# Patient Record
Sex: Female | Born: 1960 | Race: Black or African American | Hispanic: No | Smoking: Never smoker
Health system: Southern US, Community
[De-identification: ages and names within clinical notes are randomized; demographics above are authoritative.]

## PROBLEM LIST (undated history)

## (undated) DIAGNOSIS — I10 Essential (primary) hypertension: Secondary | ICD-10-CM

## (undated) DIAGNOSIS — M069 Rheumatoid arthritis, unspecified: Secondary | ICD-10-CM

## (undated) DIAGNOSIS — G4730 Sleep apnea, unspecified: Secondary | ICD-10-CM

## (undated) DIAGNOSIS — M1990 Unspecified osteoarthritis, unspecified site: Secondary | ICD-10-CM

## (undated) DIAGNOSIS — C50919 Malignant neoplasm of unspecified site of unspecified female breast: Secondary | ICD-10-CM

## (undated) DIAGNOSIS — Z9884 Bariatric surgery status: Secondary | ICD-10-CM

## (undated) DIAGNOSIS — R7303 Prediabetes: Secondary | ICD-10-CM

## (undated) DIAGNOSIS — K219 Gastro-esophageal reflux disease without esophagitis: Secondary | ICD-10-CM

## (undated) DIAGNOSIS — T7840XA Allergy, unspecified, initial encounter: Secondary | ICD-10-CM

## (undated) HISTORY — DX: Morbid (severe) obesity due to excess calories: E66.01

## (undated) HISTORY — PX: TUBAL LIGATION: SHX77

## (undated) HISTORY — DX: Allergy, unspecified, initial encounter: T78.40XA

## (undated) HISTORY — PX: TOTAL KNEE ARTHROPLASTY: SHX125

## (undated) HISTORY — DX: Unspecified osteoarthritis, unspecified site: M19.90

## (undated) HISTORY — PX: ABDOMINAL HYSTERECTOMY: SHX81

## (undated) HISTORY — DX: Rheumatoid arthritis, unspecified: M06.9

## (undated) HISTORY — DX: Gastro-esophageal reflux disease without esophagitis: K21.9

---

## 1981-09-25 HISTORY — PX: TYMPANIC MEMBRANE REPAIR: SHX294

## 2001-09-13 ENCOUNTER — Emergency Department (HOSPITAL_COMMUNITY): Admission: EM | Admit: 2001-09-13 | Discharge: 2001-09-13 | Payer: Self-pay | Admitting: Emergency Medicine

## 2002-01-21 ENCOUNTER — Emergency Department (HOSPITAL_COMMUNITY): Admission: EM | Admit: 2002-01-21 | Discharge: 2002-01-21 | Payer: Self-pay | Admitting: Emergency Medicine

## 2002-07-18 ENCOUNTER — Other Ambulatory Visit: Admission: RE | Admit: 2002-07-18 | Discharge: 2002-07-18 | Payer: Self-pay | Admitting: Family Medicine

## 2003-02-27 ENCOUNTER — Encounter: Payer: Self-pay | Admitting: Family Medicine

## 2003-02-27 ENCOUNTER — Ambulatory Visit (HOSPITAL_COMMUNITY): Admission: RE | Admit: 2003-02-27 | Discharge: 2003-02-27 | Payer: Self-pay | Admitting: Family Medicine

## 2003-10-10 ENCOUNTER — Emergency Department (HOSPITAL_COMMUNITY): Admission: EM | Admit: 2003-10-10 | Discharge: 2003-10-10 | Payer: Self-pay | Admitting: Emergency Medicine

## 2003-10-12 ENCOUNTER — Other Ambulatory Visit: Admission: RE | Admit: 2003-10-12 | Discharge: 2003-10-12 | Payer: Self-pay | Admitting: Gynecology

## 2003-12-22 ENCOUNTER — Ambulatory Visit (HOSPITAL_COMMUNITY): Admission: RE | Admit: 2003-12-22 | Discharge: 2003-12-22 | Payer: Self-pay | Admitting: Gastroenterology

## 2003-12-22 ENCOUNTER — Encounter (INDEPENDENT_AMBULATORY_CARE_PROVIDER_SITE_OTHER): Payer: Self-pay | Admitting: Specialist

## 2005-12-05 ENCOUNTER — Other Ambulatory Visit: Admission: RE | Admit: 2005-12-05 | Discharge: 2005-12-05 | Payer: Self-pay | Admitting: Family Medicine

## 2006-03-30 ENCOUNTER — Encounter: Admission: RE | Admit: 2006-03-30 | Discharge: 2006-03-30 | Payer: Self-pay | Admitting: Occupational Medicine

## 2006-08-23 ENCOUNTER — Emergency Department (HOSPITAL_COMMUNITY): Admission: EM | Admit: 2006-08-23 | Discharge: 2006-08-23 | Payer: Self-pay | Admitting: Emergency Medicine

## 2006-09-25 DIAGNOSIS — C50919 Malignant neoplasm of unspecified site of unspecified female breast: Secondary | ICD-10-CM

## 2006-09-25 HISTORY — DX: Malignant neoplasm of unspecified site of unspecified female breast: C50.919

## 2006-09-25 HISTORY — PX: MASTECTOMY: SHX3

## 2006-10-26 ENCOUNTER — Encounter: Admission: RE | Admit: 2006-10-26 | Discharge: 2006-10-26 | Payer: Self-pay | Admitting: Family Medicine

## 2006-10-26 ENCOUNTER — Encounter (INDEPENDENT_AMBULATORY_CARE_PROVIDER_SITE_OTHER): Payer: Self-pay | Admitting: *Deleted

## 2006-10-26 ENCOUNTER — Encounter (INDEPENDENT_AMBULATORY_CARE_PROVIDER_SITE_OTHER): Payer: Self-pay | Admitting: Diagnostic Radiology

## 2006-11-01 ENCOUNTER — Ambulatory Visit: Payer: Self-pay | Admitting: Oncology

## 2006-11-06 ENCOUNTER — Ambulatory Visit (HOSPITAL_COMMUNITY): Admission: RE | Admit: 2006-11-06 | Discharge: 2006-11-06 | Payer: Self-pay | Admitting: Oncology

## 2006-11-07 LAB — CANCER ANTIGEN 27.29: CA 27.29: 16 U/mL (ref 0–39)

## 2006-11-07 LAB — COMPREHENSIVE METABOLIC PANEL
ALT: 16 U/L (ref 0–35)
AST: 11 U/L (ref 0–37)
Albumin: 4.1 g/dL (ref 3.5–5.2)
Calcium: 9.1 mg/dL (ref 8.4–10.5)
Chloride: 100 mEq/L (ref 96–112)
Potassium: 3.7 mEq/L (ref 3.5–5.3)
Sodium: 137 mEq/L (ref 135–145)

## 2006-11-07 LAB — CBC WITH DIFFERENTIAL/PLATELET
BASO%: 0.6 % (ref 0.0–2.0)
EOS%: 0.8 % (ref 0.0–7.0)
MCH: 28 pg (ref 26.0–34.0)
MCHC: 34.1 g/dL (ref 32.0–36.0)
RDW: 15.2 % — ABNORMAL HIGH (ref 11.3–14.5)
lymph#: 2.2 10*3/uL (ref 0.9–3.3)

## 2006-11-07 LAB — LACTATE DEHYDROGENASE: LDH: 140 U/L (ref 94–250)

## 2006-11-08 ENCOUNTER — Ambulatory Visit (HOSPITAL_COMMUNITY): Admission: RE | Admit: 2006-11-08 | Discharge: 2006-11-08 | Payer: Self-pay | Admitting: Oncology

## 2006-11-08 ENCOUNTER — Ambulatory Visit (HOSPITAL_COMMUNITY): Admission: RE | Admit: 2006-11-08 | Discharge: 2006-11-08 | Payer: Self-pay | Admitting: Gynecology

## 2006-11-09 ENCOUNTER — Encounter: Admission: RE | Admit: 2006-11-09 | Discharge: 2006-11-09 | Payer: Self-pay | Admitting: Family Medicine

## 2006-11-14 LAB — CBC & DIFF AND RETIC
Eosinophils Absolute: 0.1 10*3/uL (ref 0.0–0.5)
IRF: 0.31 (ref 0.130–0.330)
MONO#: 0.5 10*3/uL (ref 0.1–0.9)
MONO%: 6.2 % (ref 0.0–13.0)
NEUT#: 4.9 10*3/uL (ref 1.5–6.5)
RBC: 3.83 10*6/uL (ref 3.70–5.32)
RDW: 14.8 % — ABNORMAL HIGH (ref 11.3–14.5)
RETIC #: 31.8 10*3/uL (ref 19.7–115.1)
Retic %: 0.8 % (ref 0.4–2.3)
WBC: 7.9 10*3/uL (ref 3.9–10.0)
lymph#: 2.5 10*3/uL (ref 0.9–3.3)

## 2006-11-14 LAB — IRON AND TIBC: Iron: 36 ug/dL — ABNORMAL LOW (ref 42–145)

## 2006-11-14 LAB — FERRITIN: Ferritin: 15 ng/mL (ref 10–291)

## 2006-11-20 ENCOUNTER — Encounter (INDEPENDENT_AMBULATORY_CARE_PROVIDER_SITE_OTHER): Payer: Self-pay | Admitting: Specialist

## 2006-11-20 ENCOUNTER — Encounter: Admission: RE | Admit: 2006-11-20 | Discharge: 2006-11-20 | Payer: Self-pay | Admitting: Family Medicine

## 2006-11-23 LAB — COMPREHENSIVE METABOLIC PANEL
ALT: 18 U/L (ref 0–35)
AST: 13 U/L (ref 0–37)
Albumin: 4 g/dL (ref 3.5–5.2)
BUN: 16 mg/dL (ref 6–23)
CO2: 27 mEq/L (ref 19–32)
Calcium: 9.4 mg/dL (ref 8.4–10.5)
Chloride: 103 mEq/L (ref 96–112)
Creatinine, Ser: 0.59 mg/dL (ref 0.40–1.20)
Potassium: 4 mEq/L (ref 3.5–5.3)

## 2006-11-23 LAB — CBC WITH DIFFERENTIAL/PLATELET
Basophils Absolute: 0 10*3/uL (ref 0.0–0.1)
HCT: 32.7 % — ABNORMAL LOW (ref 34.8–46.6)
HGB: 10.8 g/dL — ABNORMAL LOW (ref 11.6–15.9)
MONO#: 0.6 10*3/uL (ref 0.1–0.9)
NEUT#: 5.6 10*3/uL (ref 1.5–6.5)
NEUT%: 60.5 % (ref 39.6–76.8)
RDW: 14.9 % — ABNORMAL HIGH (ref 11.3–14.5)
WBC: 9.3 10*3/uL (ref 3.9–10.0)
lymph#: 3 10*3/uL (ref 0.9–3.3)

## 2006-11-23 LAB — LACTATE DEHYDROGENASE: LDH: 143 U/L (ref 94–250)

## 2006-12-04 ENCOUNTER — Encounter (INDEPENDENT_AMBULATORY_CARE_PROVIDER_SITE_OTHER): Payer: Self-pay | Admitting: *Deleted

## 2006-12-04 ENCOUNTER — Encounter (INDEPENDENT_AMBULATORY_CARE_PROVIDER_SITE_OTHER): Payer: Self-pay | Admitting: General Surgery

## 2006-12-05 ENCOUNTER — Encounter (INDEPENDENT_AMBULATORY_CARE_PROVIDER_SITE_OTHER): Payer: Self-pay | Admitting: General Surgery

## 2006-12-05 ENCOUNTER — Inpatient Hospital Stay (HOSPITAL_COMMUNITY): Admission: AD | Admit: 2006-12-05 | Discharge: 2006-12-06 | Payer: Self-pay | Admitting: General Surgery

## 2006-12-18 ENCOUNTER — Ambulatory Visit: Payer: Self-pay | Admitting: Oncology

## 2007-02-04 ENCOUNTER — Ambulatory Visit: Payer: Self-pay | Admitting: Oncology

## 2007-02-06 LAB — CBC WITH DIFFERENTIAL/PLATELET
BASO%: 0.7 % (ref 0.0–2.0)
EOS%: 1.7 % (ref 0.0–7.0)
Eosinophils Absolute: 0.1 10*3/uL (ref 0.0–0.5)
LYMPH%: 28.2 % (ref 14.0–48.0)
MCHC: 32.4 g/dL (ref 32.0–36.0)
MCV: 81.9 fL (ref 81.0–101.0)
MONO%: 6.4 % (ref 0.0–13.0)
NEUT#: 5.3 10*3/uL (ref 1.5–6.5)
Platelets: 261 10*3/uL (ref 145–400)
RBC: 3.93 10*6/uL (ref 3.70–5.32)
RDW: 13 % (ref 11.3–14.5)

## 2007-02-06 LAB — COMPREHENSIVE METABOLIC PANEL
ALT: 19 U/L (ref 0–35)
AST: 11 U/L (ref 0–37)
Albumin: 3.7 g/dL (ref 3.5–5.2)
Alkaline Phosphatase: 88 U/L (ref 39–117)
Glucose, Bld: 97 mg/dL (ref 70–99)
Potassium: 3.8 mEq/L (ref 3.5–5.3)
Sodium: 141 mEq/L (ref 135–145)
Total Bilirubin: 0.2 mg/dL — ABNORMAL LOW (ref 0.3–1.2)
Total Protein: 7.3 g/dL (ref 6.0–8.3)

## 2007-02-27 LAB — CBC WITH DIFFERENTIAL/PLATELET
Basophils Absolute: 0.1 10*3/uL (ref 0.0–0.1)
EOS%: 2 % (ref 0.0–7.0)
Eosinophils Absolute: 0.1 10*3/uL (ref 0.0–0.5)
HGB: 9.9 g/dL — ABNORMAL LOW (ref 11.6–15.9)
LYMPH%: 30.5 % (ref 14.0–48.0)
MCH: 26.7 pg (ref 26.0–34.0)
MCV: 80.5 fL — ABNORMAL LOW (ref 81.0–101.0)
MONO%: 7.4 % (ref 0.0–13.0)
NEUT%: 59.4 % (ref 39.6–76.8)
Platelets: 259 10*3/uL (ref 145–400)
RDW: 16.1 % — ABNORMAL HIGH (ref 11.3–14.5)

## 2007-03-06 LAB — CBC WITH DIFFERENTIAL/PLATELET
EOS%: 2.2 % (ref 0.0–7.0)
LYMPH%: 36.4 % (ref 14.0–48.0)
MCH: 26.1 pg (ref 26.0–34.0)
MCHC: 32.2 g/dL (ref 32.0–36.0)
MCV: 81 fL (ref 81.0–101.0)
MONO%: 7.9 % (ref 0.0–13.0)
NEUT#: 3.6 10*3/uL (ref 1.5–6.5)
Platelets: 281 10*3/uL (ref 145–400)
RBC: 3.97 10*6/uL (ref 3.70–5.32)
RDW: 13.7 % (ref 11.3–14.5)

## 2007-03-11 ENCOUNTER — Encounter (INDEPENDENT_AMBULATORY_CARE_PROVIDER_SITE_OTHER): Payer: Self-pay | Admitting: Obstetrics and Gynecology

## 2007-03-11 ENCOUNTER — Encounter (INDEPENDENT_AMBULATORY_CARE_PROVIDER_SITE_OTHER): Payer: Self-pay | Admitting: General Surgery

## 2007-03-12 ENCOUNTER — Inpatient Hospital Stay (HOSPITAL_COMMUNITY): Admission: RE | Admit: 2007-03-12 | Discharge: 2007-03-13 | Payer: Self-pay | Admitting: General Surgery

## 2007-03-20 LAB — CBC WITH DIFFERENTIAL/PLATELET
BASO%: 0.6 % (ref 0.0–2.0)
Eosinophils Absolute: 0.2 10*3/uL (ref 0.0–0.5)
LYMPH%: 30.8 % (ref 14.0–48.0)
MCHC: 32.6 g/dL (ref 32.0–36.0)
MONO#: 0.8 10*3/uL (ref 0.1–0.9)
MONO%: 9.8 % (ref 0.0–13.0)
NEUT#: 4.8 10*3/uL (ref 1.5–6.5)
Platelets: 303 10*3/uL (ref 145–400)
RBC: 3.57 10*6/uL — ABNORMAL LOW (ref 3.70–5.32)
RDW: 13.6 % (ref 11.3–14.5)
WBC: 8.4 10*3/uL (ref 3.9–10.0)

## 2007-03-20 LAB — COMPREHENSIVE METABOLIC PANEL
ALT: 22 U/L (ref 0–35)
Albumin: 3.7 g/dL (ref 3.5–5.2)
Alkaline Phosphatase: 93 U/L (ref 39–117)
CO2: 27 mEq/L (ref 19–32)
Glucose, Bld: 93 mg/dL (ref 70–99)
Potassium: 3.9 mEq/L (ref 3.5–5.3)
Sodium: 143 mEq/L (ref 135–145)
Total Bilirubin: 0.2 mg/dL — ABNORMAL LOW (ref 0.3–1.2)
Total Protein: 7.3 g/dL (ref 6.0–8.3)

## 2007-03-20 LAB — URINALYSIS, MICROSCOPIC - CHCC
Bilirubin (Urine): NEGATIVE
Glucose: NEGATIVE g/dL
Ketones: NEGATIVE mg/dL
Protein: NEGATIVE mg/dL

## 2007-03-21 ENCOUNTER — Ambulatory Visit: Payer: Self-pay | Admitting: Oncology

## 2007-03-21 LAB — URINE CULTURE

## 2007-03-27 LAB — CBC WITH DIFFERENTIAL/PLATELET
BASO%: 0.6 % (ref 0.0–2.0)
EOS%: 2.4 % (ref 0.0–7.0)
HCT: 29.6 % — ABNORMAL LOW (ref 34.8–46.6)
LYMPH%: 30.9 % (ref 14.0–48.0)
MCH: 25.9 pg — ABNORMAL LOW (ref 26.0–34.0)
MCHC: 32.4 g/dL (ref 32.0–36.0)
NEUT%: 58.3 % (ref 39.6–76.8)
lymph#: 2.6 10*3/uL (ref 0.9–3.3)

## 2007-03-27 LAB — COMPREHENSIVE METABOLIC PANEL
ALT: 19 U/L (ref 0–35)
AST: 16 U/L (ref 0–37)
Albumin: 3.8 g/dL (ref 3.5–5.2)
Alkaline Phosphatase: 98 U/L (ref 39–117)
BUN: 16 mg/dL (ref 6–23)
CO2: 27 mEq/L (ref 19–32)
Calcium: 9.3 mg/dL (ref 8.4–10.5)
Chloride: 103 mEq/L (ref 96–112)
Creatinine, Ser: 0.58 mg/dL (ref 0.40–1.20)
Glucose, Bld: 100 mg/dL — ABNORMAL HIGH (ref 70–99)
Potassium: 3.8 mEq/L (ref 3.5–5.3)
Sodium: 141 mEq/L (ref 135–145)
Total Bilirubin: 0.2 mg/dL — ABNORMAL LOW (ref 0.3–1.2)
Total Protein: 7.8 g/dL (ref 6.0–8.3)

## 2007-04-03 LAB — CBC WITH DIFFERENTIAL/PLATELET
BASO%: 0.6 % (ref 0.0–2.0)
EOS%: 3 % (ref 0.0–7.0)
HCT: 30.5 % — ABNORMAL LOW (ref 34.8–46.6)
LYMPH%: 38.7 % (ref 14.0–48.0)
MCH: 26.1 pg (ref 26.0–34.0)
MCHC: 32.3 g/dL (ref 32.0–36.0)
MCV: 80.8 fL — ABNORMAL LOW (ref 81.0–101.0)
NEUT%: 50.5 % (ref 39.6–76.8)
Platelets: 319 10*3/uL (ref 145–400)

## 2007-04-03 LAB — COMPREHENSIVE METABOLIC PANEL
ALT: 15 U/L (ref 0–35)
AST: 16 U/L (ref 0–37)
Creatinine, Ser: 0.63 mg/dL (ref 0.40–1.20)
Total Bilirubin: 0.2 mg/dL — ABNORMAL LOW (ref 0.3–1.2)

## 2007-04-10 ENCOUNTER — Ambulatory Visit: Payer: Self-pay | Admitting: Oncology

## 2007-04-10 LAB — COMPREHENSIVE METABOLIC PANEL
ALT: 18 U/L (ref 0–35)
AST: 15 U/L (ref 0–37)
Alkaline Phosphatase: 96 U/L (ref 39–117)
BUN: 16 mg/dL (ref 6–23)
Calcium: 9.4 mg/dL (ref 8.4–10.5)
Chloride: 103 mEq/L (ref 96–112)
Creatinine, Ser: 0.62 mg/dL (ref 0.40–1.20)
Total Bilirubin: 0.2 mg/dL — ABNORMAL LOW (ref 0.3–1.2)

## 2007-04-10 LAB — CBC WITH DIFFERENTIAL/PLATELET
BASO%: 0.6 % (ref 0.0–2.0)
Basophils Absolute: 0 10*3/uL (ref 0.0–0.1)
EOS%: 3.3 % (ref 0.0–7.0)
HCT: 32.8 % — ABNORMAL LOW (ref 34.8–46.6)
HGB: 10.2 g/dL — ABNORMAL LOW (ref 11.6–15.9)
LYMPH%: 37.4 % (ref 14.0–48.0)
MCH: 25.4 pg — ABNORMAL LOW (ref 26.0–34.0)
MCHC: 31 g/dL — ABNORMAL LOW (ref 32.0–36.0)
MCV: 81.7 fL (ref 81.0–101.0)
MONO%: 7.7 % (ref 0.0–13.0)
NEUT%: 51 % (ref 39.6–76.8)
lymph#: 2.9 10*3/uL (ref 0.9–3.3)

## 2007-04-17 LAB — CBC WITH DIFFERENTIAL/PLATELET
BASO%: 0.8 % (ref 0.0–2.0)
Basophils Absolute: 0.1 10*3/uL (ref 0.0–0.1)
EOS%: 1.1 % (ref 0.0–7.0)
HCT: 32.9 % — ABNORMAL LOW (ref 34.8–46.6)
HGB: 10.6 g/dL — ABNORMAL LOW (ref 11.6–15.9)
LYMPH%: 32.3 % (ref 14.0–48.0)
MCH: 26 pg (ref 26.0–34.0)
MCHC: 32.1 g/dL (ref 32.0–36.0)
MCV: 80.9 fL — ABNORMAL LOW (ref 81.0–101.0)
NEUT%: 59.2 % (ref 39.6–76.8)
Platelets: 278 10*3/uL (ref 145–400)
lymph#: 3.1 10*3/uL (ref 0.9–3.3)

## 2007-04-17 LAB — COMPREHENSIVE METABOLIC PANEL
ALT: 16 U/L (ref 0–35)
AST: 13 U/L (ref 0–37)
Albumin: 3.9 g/dL (ref 3.5–5.2)
Alkaline Phosphatase: 94 U/L (ref 39–117)
BUN: 22 mg/dL (ref 6–23)
CO2: 27 mEq/L (ref 19–32)
Calcium: 9.1 mg/dL (ref 8.4–10.5)
Chloride: 106 mEq/L (ref 96–112)
Creatinine, Ser: 0.76 mg/dL (ref 0.40–1.20)
Glucose, Bld: 102 mg/dL — ABNORMAL HIGH (ref 70–99)
Potassium: 3.8 mEq/L (ref 3.5–5.3)
Sodium: 140 mEq/L (ref 135–145)
Total Bilirubin: 0.3 mg/dL (ref 0.3–1.2)
Total Protein: 7.9 g/dL (ref 6.0–8.3)

## 2007-04-24 LAB — CBC WITH DIFFERENTIAL/PLATELET
Basophils Absolute: 0.1 10*3/uL (ref 0.0–0.1)
HCT: 36.6 % (ref 34.8–46.6)
HGB: 11.5 g/dL — ABNORMAL LOW (ref 11.6–15.9)
LYMPH%: 36.5 % (ref 14.0–48.0)
MONO#: 0.8 10*3/uL (ref 0.1–0.9)
NEUT%: 54.6 % (ref 39.6–76.8)
Platelets: 326 10*3/uL (ref 145–400)
WBC: 10.9 10*3/uL — ABNORMAL HIGH (ref 3.9–10.0)
lymph#: 4 10*3/uL — ABNORMAL HIGH (ref 0.9–3.3)

## 2007-05-03 LAB — CBC WITH DIFFERENTIAL/PLATELET
BASO%: 0.4 % (ref 0.0–2.0)
EOS%: 1.4 % (ref 0.0–7.0)
Eosinophils Absolute: 0.1 10*3/uL (ref 0.0–0.5)
LYMPH%: 35.1 % (ref 14.0–48.0)
MCH: 25.5 pg — ABNORMAL LOW (ref 26.0–34.0)
MCHC: 31.4 g/dL — ABNORMAL LOW (ref 32.0–36.0)
MCV: 81.1 fL (ref 81.0–101.0)
MONO%: 6.5 % (ref 0.0–13.0)
Platelets: 240 10*3/uL (ref 145–400)
RBC: 4.23 10*6/uL (ref 3.70–5.32)
RDW: 13.5 % (ref 11.3–14.5)

## 2007-05-03 LAB — COMPREHENSIVE METABOLIC PANEL
AST: 17 U/L (ref 0–37)
Alkaline Phosphatase: 93 U/L (ref 39–117)
Glucose, Bld: 98 mg/dL (ref 70–99)
Sodium: 140 mEq/L (ref 135–145)
Total Bilirubin: 0.2 mg/dL — ABNORMAL LOW (ref 0.3–1.2)
Total Protein: 7.4 g/dL (ref 6.0–8.3)

## 2007-05-10 LAB — COMPREHENSIVE METABOLIC PANEL
Alkaline Phosphatase: 104 U/L (ref 39–117)
BUN: 18 mg/dL (ref 6–23)
Glucose, Bld: 130 mg/dL — ABNORMAL HIGH (ref 70–99)
Sodium: 140 mEq/L (ref 135–145)
Total Bilirubin: 0.2 mg/dL — ABNORMAL LOW (ref 0.3–1.2)

## 2007-05-10 LAB — CBC WITH DIFFERENTIAL/PLATELET
Basophils Absolute: 0 10*3/uL (ref 0.0–0.1)
Eosinophils Absolute: 0 10*3/uL (ref 0.0–0.5)
LYMPH%: 15.5 % (ref 14.0–48.0)
MCH: 26.3 pg (ref 26.0–34.0)
MCV: 78.8 fL — ABNORMAL LOW (ref 81.0–101.0)
MONO%: 3.4 % (ref 0.0–13.0)
NEUT#: 11.9 10*3/uL — ABNORMAL HIGH (ref 1.5–6.5)
Platelets: 278 10*3/uL (ref 145–400)
RBC: 4.09 10*6/uL (ref 3.70–5.32)

## 2007-05-14 ENCOUNTER — Ambulatory Visit: Payer: Self-pay

## 2007-05-14 ENCOUNTER — Encounter: Payer: Self-pay | Admitting: Oncology

## 2007-05-17 LAB — COMPREHENSIVE METABOLIC PANEL
Albumin: 3.9 g/dL (ref 3.5–5.2)
Alkaline Phosphatase: 102 U/L (ref 39–117)
BUN: 17 mg/dL (ref 6–23)
Glucose, Bld: 118 mg/dL — ABNORMAL HIGH (ref 70–99)
Potassium: 3.9 mEq/L (ref 3.5–5.3)
Total Bilirubin: 0.2 mg/dL — ABNORMAL LOW (ref 0.3–1.2)

## 2007-05-17 LAB — CBC WITH DIFFERENTIAL/PLATELET
Basophils Absolute: 0.1 10*3/uL (ref 0.0–0.1)
Eosinophils Absolute: 0 10*3/uL (ref 0.0–0.5)
HCT: 36.7 % (ref 34.8–46.6)
HGB: 11.5 g/dL — ABNORMAL LOW (ref 11.6–15.9)
LYMPH%: 30.7 % (ref 14.0–48.0)
MCV: 80.2 fL — ABNORMAL LOW (ref 81.0–101.0)
MONO%: 9.1 % (ref 0.0–13.0)
NEUT#: 7 10*3/uL — ABNORMAL HIGH (ref 1.5–6.5)
Platelets: 282 10*3/uL (ref 145–400)

## 2007-05-22 ENCOUNTER — Ambulatory Visit: Payer: Self-pay | Admitting: Oncology

## 2007-05-24 LAB — COMPREHENSIVE METABOLIC PANEL
Alkaline Phosphatase: 88 U/L (ref 39–117)
BUN: 20 mg/dL (ref 6–23)
Glucose, Bld: 115 mg/dL — ABNORMAL HIGH (ref 70–99)
Sodium: 138 mEq/L (ref 135–145)
Total Bilirubin: 0.3 mg/dL (ref 0.3–1.2)

## 2007-05-24 LAB — CBC WITH DIFFERENTIAL/PLATELET
Basophils Absolute: 0.1 10*3/uL (ref 0.0–0.1)
Eosinophils Absolute: 0 10*3/uL (ref 0.0–0.5)
LYMPH%: 37.6 % (ref 14.0–48.0)
MCH: 26 pg (ref 26.0–34.0)
MCV: 79.6 fL — ABNORMAL LOW (ref 81.0–101.0)
MONO%: 7.1 % (ref 0.0–13.0)
NEUT#: 4.2 10*3/uL (ref 1.5–6.5)
Platelets: 172 10*3/uL (ref 145–400)
RBC: 4.17 10*6/uL (ref 3.70–5.32)

## 2007-05-31 LAB — CBC WITH DIFFERENTIAL/PLATELET
Basophils Absolute: 0 10*3/uL (ref 0.0–0.1)
EOS%: 0 % (ref 0.0–7.0)
Eosinophils Absolute: 0 10*3/uL (ref 0.0–0.5)
HGB: 10.1 g/dL — ABNORMAL LOW (ref 11.6–15.9)
NEUT#: 13.6 10*3/uL — ABNORMAL HIGH (ref 1.5–6.5)
RDW: 14.8 % — ABNORMAL HIGH (ref 11.3–14.5)
lymph#: 2.1 10*3/uL (ref 0.9–3.3)

## 2007-05-31 LAB — COMPREHENSIVE METABOLIC PANEL
Albumin: 4.1 g/dL (ref 3.5–5.2)
BUN: 19 mg/dL (ref 6–23)
Calcium: 9.1 mg/dL (ref 8.4–10.5)
Chloride: 103 mEq/L (ref 96–112)
Glucose, Bld: 187 mg/dL — ABNORMAL HIGH (ref 70–99)
Potassium: 3.7 mEq/L (ref 3.5–5.3)

## 2007-06-07 LAB — COMPREHENSIVE METABOLIC PANEL
ALT: 21 U/L (ref 0–35)
Albumin: 3.9 g/dL (ref 3.5–5.2)
Alkaline Phosphatase: 77 U/L (ref 39–117)
CO2: 25 mEq/L (ref 19–32)
Glucose, Bld: 128 mg/dL — ABNORMAL HIGH (ref 70–99)
Potassium: 3.7 mEq/L (ref 3.5–5.3)
Sodium: 137 mEq/L (ref 135–145)
Total Protein: 7 g/dL (ref 6.0–8.3)

## 2007-06-07 LAB — CBC WITH DIFFERENTIAL/PLATELET
Basophils Absolute: 0.1 10*3/uL (ref 0.0–0.1)
Eosinophils Absolute: 0 10*3/uL (ref 0.0–0.5)
HGB: 10.1 g/dL — ABNORMAL LOW (ref 11.6–15.9)
MCV: 78.9 fL — ABNORMAL LOW (ref 81.0–101.0)
MONO#: 0.2 10*3/uL (ref 0.1–0.9)
MONO%: 5 % (ref 0.0–13.0)
NEUT#: 1.5 10*3/uL (ref 1.5–6.5)
Platelets: 200 10*3/uL (ref 145–400)
RDW: 14.8 % — ABNORMAL HIGH (ref 11.3–14.5)

## 2007-06-14 LAB — CBC WITH DIFFERENTIAL/PLATELET
Basophils Absolute: 0.1 10*3/uL (ref 0.0–0.1)
EOS%: 0.1 % (ref 0.0–7.0)
HCT: 32.2 % — ABNORMAL LOW (ref 34.8–46.6)
LYMPH%: 52.9 % — ABNORMAL HIGH (ref 14.0–48.0)
MCH: 25.2 pg — ABNORMAL LOW (ref 26.0–34.0)
MONO#: 0.6 10*3/uL (ref 0.1–0.9)
NEUT#: 1.7 10*3/uL (ref 1.5–6.5)
NEUT%: 33.7 % — ABNORMAL LOW (ref 39.6–76.8)
Platelets: 211 10*3/uL (ref 145–400)
WBC: 5.1 10*3/uL (ref 3.9–10.0)
lymph#: 2.7 10*3/uL (ref 0.9–3.3)

## 2007-06-21 LAB — CBC WITH DIFFERENTIAL/PLATELET
Basophils Absolute: 0 10*3/uL (ref 0.0–0.1)
EOS%: 0.1 % (ref 0.0–7.0)
HCT: 31.6 % — ABNORMAL LOW (ref 34.8–46.6)
HGB: 10.3 g/dL — ABNORMAL LOW (ref 11.6–15.9)
MCH: 26.3 pg (ref 26.0–34.0)
MCV: 80.5 fL — ABNORMAL LOW (ref 81.0–101.0)
MONO%: 4.6 % (ref 0.0–13.0)
NEUT%: 83.3 % — ABNORMAL HIGH (ref 39.6–76.8)

## 2007-06-21 LAB — COMPREHENSIVE METABOLIC PANEL
AST: 15 U/L (ref 0–37)
Alkaline Phosphatase: 83 U/L (ref 39–117)
BUN: 26 mg/dL — ABNORMAL HIGH (ref 6–23)
Calcium: 9 mg/dL (ref 8.4–10.5)
Creatinine, Ser: 0.82 mg/dL (ref 0.40–1.20)
Total Bilirubin: 0.2 mg/dL — ABNORMAL LOW (ref 0.3–1.2)

## 2007-06-28 LAB — CBC WITH DIFFERENTIAL/PLATELET
Basophils Absolute: 0.1 10*3/uL (ref 0.0–0.1)
EOS%: 0.4 % (ref 0.0–7.0)
HCT: 32.5 % — ABNORMAL LOW (ref 34.8–46.6)
HGB: 10.6 g/dL — ABNORMAL LOW (ref 11.6–15.9)
MCH: 26.3 pg (ref 26.0–34.0)
MCV: 80.2 fL — ABNORMAL LOW (ref 81.0–101.0)
NEUT%: 28.9 % — ABNORMAL LOW (ref 39.6–76.8)
lymph#: 2.5 10*3/uL (ref 0.9–3.3)

## 2007-07-03 ENCOUNTER — Ambulatory Visit: Payer: Self-pay | Admitting: Oncology

## 2007-07-05 LAB — CBC WITH DIFFERENTIAL/PLATELET
BASO%: 1.3 % (ref 0.0–2.0)
HGB: 10.5 g/dL — ABNORMAL LOW (ref 11.6–15.9)
LYMPH%: 48.8 % — ABNORMAL HIGH (ref 14.0–48.0)
MCH: 25.9 pg — ABNORMAL LOW (ref 26.0–34.0)
MONO#: 0.7 10*3/uL (ref 0.1–0.9)
MONO%: 13.9 % — ABNORMAL HIGH (ref 0.0–13.0)
NEUT#: 1.9 10*3/uL (ref 1.5–6.5)
NEUT%: 35.7 % — ABNORMAL LOW (ref 39.6–76.8)
RDW: 16.3 % — ABNORMAL HIGH (ref 11.3–14.5)
lymph#: 2.5 10*3/uL (ref 0.9–3.3)

## 2007-07-12 LAB — CBC WITH DIFFERENTIAL/PLATELET
Basophils Absolute: 0.1 10*3/uL (ref 0.0–0.1)
EOS%: 0 % (ref 0.0–7.0)
HGB: 10 g/dL — ABNORMAL LOW (ref 11.6–15.9)
LYMPH%: 15.3 % (ref 14.0–48.0)
MCH: 26 pg (ref 26.0–34.0)
MCV: 80.6 fL — ABNORMAL LOW (ref 81.0–101.0)
MONO%: 6.7 % (ref 0.0–13.0)
NEUT%: 77.5 % — ABNORMAL HIGH (ref 39.6–76.8)
RDW: 17 % — ABNORMAL HIGH (ref 11.3–14.5)

## 2007-07-12 LAB — COMPREHENSIVE METABOLIC PANEL
ALT: 20 U/L (ref 0–35)
AST: 15 U/L (ref 0–37)
Albumin: 4.2 g/dL (ref 3.5–5.2)
BUN: 19 mg/dL (ref 6–23)
Calcium: 9.4 mg/dL (ref 8.4–10.5)
Chloride: 104 mEq/L (ref 96–112)
Potassium: 3.9 mEq/L (ref 3.5–5.3)

## 2007-07-19 LAB — CBC WITH DIFFERENTIAL/PLATELET
BASO%: 1.3 % (ref 0.0–2.0)
EOS%: 0.2 % (ref 0.0–7.0)
MCH: 26.6 pg (ref 26.0–34.0)
MCHC: 33.5 g/dL (ref 32.0–36.0)
RDW: 17.2 % — ABNORMAL HIGH (ref 11.3–14.5)
lymph#: 3.5 10*3/uL — ABNORMAL HIGH (ref 0.9–3.3)

## 2007-07-26 LAB — CBC WITH DIFFERENTIAL/PLATELET
BASO%: 0.7 % (ref 0.0–2.0)
EOS%: 0.1 % (ref 0.0–7.0)
HCT: 30.9 % — ABNORMAL LOW (ref 34.8–46.6)
LYMPH%: 26.3 % (ref 14.0–48.0)
MCH: 26.8 pg (ref 26.0–34.0)
MCHC: 32.9 g/dL (ref 32.0–36.0)
NEUT%: 65.8 % (ref 39.6–76.8)
Platelets: 206 10*3/uL (ref 145–400)
RBC: 3.79 10*6/uL (ref 3.70–5.32)
WBC: 10.5 10*3/uL — ABNORMAL HIGH (ref 3.9–10.0)
lymph#: 2.8 10*3/uL (ref 0.9–3.3)

## 2007-08-02 LAB — URINALYSIS, MICROSCOPIC - CHCC
Bilirubin (Urine): NEGATIVE
Ketones: NEGATIVE mg/dL
Protein: NEGATIVE mg/dL

## 2007-08-02 LAB — CBC WITH DIFFERENTIAL/PLATELET
BASO%: 0.3 % (ref 0.0–2.0)
EOS%: 0.1 % (ref 0.0–7.0)
MCH: 26.8 pg (ref 26.0–34.0)
MCHC: 32.5 g/dL (ref 32.0–36.0)
MONO#: 0.8 10*3/uL (ref 0.1–0.9)
RDW: 19.3 % — ABNORMAL HIGH (ref 11.3–14.5)
WBC: 12.4 10*3/uL — ABNORMAL HIGH (ref 3.9–10.0)
lymph#: 2.4 10*3/uL (ref 0.9–3.3)

## 2007-08-02 LAB — COMPREHENSIVE METABOLIC PANEL
ALT: 20 U/L (ref 0–35)
BUN: 25 mg/dL — ABNORMAL HIGH (ref 6–23)
CO2: 26 mEq/L (ref 19–32)
Calcium: 9.7 mg/dL (ref 8.4–10.5)
Chloride: 103 mEq/L (ref 96–112)
Creatinine, Ser: 0.68 mg/dL (ref 0.40–1.20)
Total Bilirubin: 0.3 mg/dL (ref 0.3–1.2)

## 2007-08-04 LAB — URINE CULTURE

## 2007-08-09 LAB — CBC WITH DIFFERENTIAL/PLATELET
BASO%: 0.8 % (ref 0.0–2.0)
Basophils Absolute: 0.1 10*3/uL (ref 0.0–0.1)
Eosinophils Absolute: 0 10*3/uL (ref 0.0–0.5)
HCT: 29.3 % — ABNORMAL LOW (ref 34.8–46.6)
HGB: 9.9 g/dL — ABNORMAL LOW (ref 11.6–15.9)
LYMPH%: 41.3 % (ref 14.0–48.0)
MCHC: 33.7 g/dL (ref 32.0–36.0)
MONO#: 0.7 10*3/uL (ref 0.1–0.9)
NEUT#: 3.1 10*3/uL (ref 1.5–6.5)
NEUT%: 46.3 % (ref 39.6–76.8)
Platelets: 191 10*3/uL (ref 145–400)
WBC: 6.7 10*3/uL (ref 3.9–10.0)
lymph#: 2.8 10*3/uL (ref 0.9–3.3)

## 2007-08-14 ENCOUNTER — Ambulatory Visit: Payer: Self-pay | Admitting: Oncology

## 2007-08-16 LAB — CBC WITH DIFFERENTIAL/PLATELET
BASO%: 0.6 % (ref 0.0–2.0)
Basophils Absolute: 0 10*3/uL (ref 0.0–0.1)
EOS%: 0.6 % (ref 0.0–7.0)
HCT: 30 % — ABNORMAL LOW (ref 34.8–46.6)
HGB: 9.8 g/dL — ABNORMAL LOW (ref 11.6–15.9)
LYMPH%: 35.7 % (ref 14.0–48.0)
MCH: 27.2 pg (ref 26.0–34.0)
MCHC: 32.5 g/dL (ref 32.0–36.0)
MCV: 83.8 fL (ref 81.0–101.0)
NEUT%: 56.7 % (ref 39.6–76.8)
Platelets: 195 10*3/uL (ref 145–400)
lymph#: 2.7 10*3/uL (ref 0.9–3.3)

## 2007-08-23 LAB — CBC WITH DIFFERENTIAL/PLATELET
BASO%: 0.3 % (ref 0.0–2.0)
Basophils Absolute: 0 10*3/uL (ref 0.0–0.1)
EOS%: 0 % (ref 0.0–7.0)
HGB: 9.4 g/dL — ABNORMAL LOW (ref 11.6–15.9)
MCH: 27.9 pg (ref 26.0–34.0)
MCHC: 32.9 g/dL (ref 32.0–36.0)
MCV: 84.9 fL (ref 81.0–101.0)
MONO%: 4.4 % (ref 0.0–13.0)
NEUT%: 81.8 % — ABNORMAL HIGH (ref 39.6–76.8)
RDW: 19.2 % — ABNORMAL HIGH (ref 11.3–14.5)

## 2007-08-23 LAB — COMPREHENSIVE METABOLIC PANEL
Albumin: 4 g/dL (ref 3.5–5.2)
BUN: 24 mg/dL — ABNORMAL HIGH (ref 6–23)
CO2: 25 mEq/L (ref 19–32)
Calcium: 9.1 mg/dL (ref 8.4–10.5)
Chloride: 104 mEq/L (ref 96–112)
Glucose, Bld: 240 mg/dL — ABNORMAL HIGH (ref 70–99)
Potassium: 3.8 mEq/L (ref 3.5–5.3)
Sodium: 142 mEq/L (ref 135–145)
Total Protein: 7 g/dL (ref 6.0–8.3)

## 2007-08-23 LAB — LACTATE DEHYDROGENASE: LDH: 289 U/L — ABNORMAL HIGH (ref 94–250)

## 2007-08-30 LAB — CBC WITH DIFFERENTIAL/PLATELET
BASO%: 1.1 % (ref 0.0–2.0)
Eosinophils Absolute: 0 10*3/uL (ref 0.0–0.5)
LYMPH%: 41 % (ref 14.0–48.0)
MCHC: 32.9 g/dL (ref 32.0–36.0)
MONO#: 1.1 10*3/uL — ABNORMAL HIGH (ref 0.1–0.9)
NEUT#: 3.5 10*3/uL (ref 1.5–6.5)
RBC: 3.58 10*6/uL — ABNORMAL LOW (ref 3.70–5.32)
RDW: 17.8 % — ABNORMAL HIGH (ref 11.3–14.5)
WBC: 8.1 10*3/uL (ref 3.9–10.0)

## 2007-09-04 ENCOUNTER — Ambulatory Visit: Payer: Self-pay | Admitting: Cardiology

## 2007-09-04 ENCOUNTER — Encounter: Payer: Self-pay | Admitting: Oncology

## 2007-09-04 ENCOUNTER — Ambulatory Visit: Admission: RE | Admit: 2007-09-04 | Discharge: 2007-09-04 | Payer: Self-pay | Admitting: Oncology

## 2007-09-06 LAB — CBC WITH DIFFERENTIAL/PLATELET
BASO%: 0.7 % (ref 0.0–2.0)
Eosinophils Absolute: 0 10*3/uL (ref 0.0–0.5)
HCT: 30.8 % — ABNORMAL LOW (ref 34.8–46.6)
LYMPH%: 30.3 % (ref 14.0–48.0)
MCHC: 33.5 g/dL (ref 32.0–36.0)
MONO#: 0.5 10*3/uL (ref 0.1–0.9)
NEUT#: 5.3 10*3/uL (ref 1.5–6.5)
NEUT%: 62.8 % (ref 39.6–76.8)
Platelets: 222 10*3/uL (ref 145–400)
RBC: 3.52 10*6/uL — ABNORMAL LOW (ref 3.70–5.32)
WBC: 8.4 10*3/uL (ref 3.9–10.0)
lymph#: 2.6 10*3/uL (ref 0.9–3.3)

## 2007-09-23 ENCOUNTER — Emergency Department (HOSPITAL_COMMUNITY): Admission: EM | Admit: 2007-09-23 | Discharge: 2007-09-24 | Payer: Self-pay | Admitting: Emergency Medicine

## 2007-10-02 ENCOUNTER — Ambulatory Visit: Payer: Self-pay | Admitting: Oncology

## 2007-10-04 ENCOUNTER — Encounter: Admission: RE | Admit: 2007-10-04 | Discharge: 2007-10-04 | Payer: Self-pay | Admitting: Oncology

## 2007-10-04 LAB — CBC WITH DIFFERENTIAL/PLATELET
Basophils Absolute: 0.1 10*3/uL (ref 0.0–0.1)
HCT: 30.6 % — ABNORMAL LOW (ref 34.8–46.6)
HGB: 9.9 g/dL — ABNORMAL LOW (ref 11.6–15.9)
LYMPH%: 32.6 % (ref 14.0–48.0)
MCHC: 32.3 g/dL (ref 32.0–36.0)
MONO#: 0.7 10*3/uL (ref 0.1–0.9)
NEUT%: 56.8 % (ref 39.6–76.8)
Platelets: 341 10*3/uL (ref 145–400)
WBC: 8.7 10*3/uL (ref 3.9–10.0)
lymph#: 2.8 10*3/uL (ref 0.9–3.3)

## 2007-10-09 LAB — COMPREHENSIVE METABOLIC PANEL
AST: 14 U/L (ref 0–37)
Albumin: 3.9 g/dL (ref 3.5–5.2)
Alkaline Phosphatase: 95 U/L (ref 39–117)
Potassium: 3.8 mEq/L (ref 3.5–5.3)
Sodium: 139 mEq/L (ref 135–145)
Total Protein: 8.2 g/dL (ref 6.0–8.3)

## 2007-10-09 LAB — CBC WITH DIFFERENTIAL/PLATELET
BASO%: 0.8 % (ref 0.0–2.0)
Basophils Absolute: 0.1 10*3/uL (ref 0.0–0.1)
EOS%: 2.6 % (ref 0.0–7.0)
HCT: 30.8 % — ABNORMAL LOW (ref 34.8–46.6)
HGB: 9.9 g/dL — ABNORMAL LOW (ref 11.6–15.9)
LYMPH%: 29.9 % (ref 14.0–48.0)
MCH: 27.1 pg (ref 26.0–34.0)
MCHC: 32.3 g/dL (ref 32.0–36.0)
MONO#: 0.6 10*3/uL (ref 0.1–0.9)
NEUT%: 59 % (ref 39.6–76.8)
Platelets: 302 10*3/uL (ref 145–400)

## 2007-10-16 ENCOUNTER — Encounter: Admission: RE | Admit: 2007-10-16 | Discharge: 2007-11-05 | Payer: Self-pay | Admitting: General Surgery

## 2007-10-30 LAB — CBC WITH DIFFERENTIAL/PLATELET
Eosinophils Absolute: 0.1 10*3/uL (ref 0.0–0.5)
HCT: 31.7 % — ABNORMAL LOW (ref 34.8–46.6)
LYMPH%: 32.6 % (ref 14.0–48.0)
MONO#: 0.3 10*3/uL (ref 0.1–0.9)
NEUT#: 3.8 10*3/uL (ref 1.5–6.5)
NEUT%: 59.9 % (ref 39.6–76.8)
Platelets: 221 10*3/uL (ref 145–400)
WBC: 6.4 10*3/uL (ref 3.9–10.0)

## 2007-11-18 ENCOUNTER — Ambulatory Visit: Payer: Self-pay | Admitting: Oncology

## 2007-11-20 LAB — CBC & DIFF AND RETIC
BASO%: 1.8 % (ref 0.0–2.0)
EOS%: 3.1 % (ref 0.0–7.0)
HCT: 31.9 % — ABNORMAL LOW (ref 34.8–46.6)
LYMPH%: 37.4 % (ref 14.0–48.0)
MCH: 27.2 pg (ref 26.0–34.0)
MCHC: 33.7 g/dL (ref 32.0–36.0)
MCV: 80.9 fL — ABNORMAL LOW (ref 81.0–101.0)
NEUT%: 52.1 % (ref 39.6–76.8)
Platelets: 243 10*3/uL (ref 145–400)

## 2007-11-22 LAB — COMPREHENSIVE METABOLIC PANEL
AST: 16 U/L (ref 0–37)
Albumin: 4 g/dL (ref 3.5–5.2)
Alkaline Phosphatase: 86 U/L (ref 39–117)
BUN: 14 mg/dL (ref 6–23)
Creatinine, Ser: 0.54 mg/dL (ref 0.40–1.20)
Potassium: 3.7 mEq/L (ref 3.5–5.3)

## 2007-11-22 LAB — IMMUNOFIXATION ELECTROPHORESIS
IgA: 417 mg/dL — ABNORMAL HIGH (ref 68–378)
IgG (Immunoglobin G), Serum: 1750 mg/dL — ABNORMAL HIGH (ref 694–1618)

## 2007-11-22 LAB — FERRITIN: Ferritin: 44 ng/mL (ref 10–291)

## 2007-12-09 ENCOUNTER — Encounter: Payer: Self-pay | Admitting: Oncology

## 2007-12-09 ENCOUNTER — Ambulatory Visit: Payer: Self-pay

## 2007-12-11 LAB — CBC WITH DIFFERENTIAL/PLATELET
Eosinophils Absolute: 0.4 10*3/uL (ref 0.0–0.5)
HGB: 10.8 g/dL — ABNORMAL LOW (ref 11.6–15.9)
LYMPH%: 30.2 % (ref 14.0–48.0)
MONO#: 0.6 10*3/uL (ref 0.1–0.9)
NEUT#: 5.9 10*3/uL (ref 1.5–6.5)
Platelets: 250 10*3/uL (ref 145–400)
RBC: 4.06 10*6/uL (ref 3.70–5.32)
WBC: 9.9 10*3/uL (ref 3.9–10.0)

## 2007-12-31 LAB — CBC WITH DIFFERENTIAL/PLATELET
BASO%: 1 % (ref 0.0–2.0)
EOS%: 4 % (ref 0.0–7.0)
MCH: 26.4 pg (ref 26.0–34.0)
MCHC: 31.8 g/dL — ABNORMAL LOW (ref 32.0–36.0)
MONO#: 0.5 10*3/uL (ref 0.1–0.9)
RBC: 4.09 10*6/uL (ref 3.70–5.32)
WBC: 8 10*3/uL (ref 3.9–10.0)
lymph#: 3 10*3/uL (ref 0.9–3.3)

## 2008-01-06 ENCOUNTER — Ambulatory Visit (HOSPITAL_COMMUNITY): Admission: RE | Admit: 2008-01-06 | Discharge: 2008-01-06 | Payer: Self-pay | Admitting: Surgery

## 2008-01-09 ENCOUNTER — Ambulatory Visit (HOSPITAL_COMMUNITY): Admission: RE | Admit: 2008-01-09 | Discharge: 2008-01-09 | Payer: Self-pay | Admitting: Surgery

## 2008-01-17 ENCOUNTER — Encounter: Admission: RE | Admit: 2008-01-17 | Discharge: 2008-01-17 | Payer: Self-pay | Admitting: Obstetrics and Gynecology

## 2008-01-20 ENCOUNTER — Ambulatory Visit: Payer: Self-pay | Admitting: Oncology

## 2008-01-22 LAB — COMPREHENSIVE METABOLIC PANEL
ALT: 14 U/L (ref 0–35)
AST: 15 U/L (ref 0–37)
CO2: 27 mEq/L (ref 19–32)
Chloride: 100 mEq/L (ref 96–112)
Sodium: 139 mEq/L (ref 135–145)
Total Bilirubin: 0.3 mg/dL (ref 0.3–1.2)
Total Protein: 7.9 g/dL (ref 6.0–8.3)

## 2008-01-22 LAB — CBC WITH DIFFERENTIAL/PLATELET
Eosinophils Absolute: 0.2 10*3/uL (ref 0.0–0.5)
MCV: 81.9 fL (ref 81.0–101.0)
MONO#: 0.5 10*3/uL (ref 0.1–0.9)
MONO%: 6.1 % (ref 0.0–13.0)
NEUT#: 3.5 10*3/uL (ref 1.5–6.5)
RBC: 4.12 10*6/uL (ref 3.70–5.32)
RDW: 16.8 % — ABNORMAL HIGH (ref 11.3–14.5)
WBC: 7.6 10*3/uL (ref 3.9–10.0)
lymph#: 3.3 10*3/uL (ref 0.9–3.3)

## 2008-01-22 LAB — LACTATE DEHYDROGENASE: LDH: 154 U/L (ref 94–250)

## 2008-01-22 LAB — CANCER ANTIGEN 27.29: CA 27.29: 19 U/mL (ref 0–39)

## 2008-03-11 ENCOUNTER — Ambulatory Visit (HOSPITAL_COMMUNITY): Admission: RE | Admit: 2008-03-11 | Discharge: 2008-03-11 | Payer: Self-pay | Admitting: Surgery

## 2008-04-25 ENCOUNTER — Emergency Department (HOSPITAL_COMMUNITY): Admission: EM | Admit: 2008-04-25 | Discharge: 2008-04-25 | Payer: Self-pay | Admitting: Emergency Medicine

## 2008-04-27 ENCOUNTER — Ambulatory Visit: Payer: Self-pay | Admitting: Oncology

## 2008-04-30 LAB — CBC WITH DIFFERENTIAL/PLATELET
BASO%: 0.3 % (ref 0.0–2.0)
Basophils Absolute: 0 10*3/uL (ref 0.0–0.1)
EOS%: 1.8 % (ref 0.0–7.0)
HGB: 11.3 g/dL — ABNORMAL LOW (ref 11.6–15.9)
MCH: 27.6 pg (ref 26.0–34.0)
MCHC: 33.3 g/dL (ref 32.0–36.0)
MCV: 82.9 fL (ref 81.0–101.0)
MONO%: 4.8 % (ref 0.0–13.0)
RBC: 4.09 10*6/uL (ref 3.70–5.32)
RDW: 16.7 % — ABNORMAL HIGH (ref 11.3–14.5)
lymph#: 3.3 10*3/uL (ref 0.9–3.3)

## 2008-05-01 LAB — COMPREHENSIVE METABOLIC PANEL
ALT: 18 U/L (ref 0–35)
AST: 14 U/L (ref 0–37)
Albumin: 4 g/dL (ref 3.5–5.2)
Alkaline Phosphatase: 105 U/L (ref 39–117)
BUN: 15 mg/dL (ref 6–23)
Calcium: 9.8 mg/dL (ref 8.4–10.5)
Chloride: 102 mEq/L (ref 96–112)
Potassium: 3.6 mEq/L (ref 3.5–5.3)
Sodium: 141 mEq/L (ref 135–145)

## 2008-05-01 LAB — VITAMIN D 25 HYDROXY (VIT D DEFICIENCY, FRACTURES): Vit D, 25-Hydroxy: 38 ng/mL (ref 30–89)

## 2008-05-01 LAB — CANCER ANTIGEN 27.29: CA 27.29: 20 U/mL (ref 0–39)

## 2008-05-01 LAB — LACTATE DEHYDROGENASE: LDH: 156 U/L (ref 94–250)

## 2008-06-03 ENCOUNTER — Encounter: Admission: RE | Admit: 2008-06-03 | Discharge: 2008-09-01 | Payer: Self-pay | Admitting: Orthopedic Surgery

## 2008-06-08 ENCOUNTER — Ambulatory Visit (HOSPITAL_BASED_OUTPATIENT_CLINIC_OR_DEPARTMENT_OTHER): Admission: RE | Admit: 2008-06-08 | Discharge: 2008-06-08 | Payer: Self-pay | Admitting: General Surgery

## 2008-07-21 ENCOUNTER — Encounter: Admission: RE | Admit: 2008-07-21 | Discharge: 2008-08-04 | Payer: Self-pay | Admitting: Surgery

## 2008-07-26 HISTORY — PX: LAPAROSCOPIC GASTRIC BANDING: SHX1100

## 2008-08-04 ENCOUNTER — Observation Stay (HOSPITAL_COMMUNITY): Admission: RE | Admit: 2008-08-04 | Discharge: 2008-08-05 | Payer: Self-pay | Admitting: Surgery

## 2008-08-12 ENCOUNTER — Encounter: Admission: RE | Admit: 2008-08-12 | Discharge: 2008-11-10 | Payer: Self-pay | Admitting: Surgery

## 2008-11-02 ENCOUNTER — Ambulatory Visit: Payer: Self-pay | Admitting: Oncology

## 2008-12-25 ENCOUNTER — Ambulatory Visit (HOSPITAL_COMMUNITY): Admission: RE | Admit: 2008-12-25 | Discharge: 2008-12-25 | Payer: Self-pay | Admitting: Anesthesiology

## 2008-12-28 ENCOUNTER — Inpatient Hospital Stay (HOSPITAL_COMMUNITY): Admission: RE | Admit: 2008-12-28 | Discharge: 2009-01-01 | Payer: Self-pay | Admitting: Orthopedic Surgery

## 2009-01-28 ENCOUNTER — Encounter: Admission: RE | Admit: 2009-01-28 | Discharge: 2009-01-28 | Payer: Self-pay | Admitting: General Surgery

## 2009-03-08 ENCOUNTER — Inpatient Hospital Stay (HOSPITAL_COMMUNITY): Admission: RE | Admit: 2009-03-08 | Discharge: 2009-03-11 | Payer: Self-pay | Admitting: Orthopedic Surgery

## 2009-05-18 ENCOUNTER — Ambulatory Visit: Payer: Self-pay | Admitting: Oncology

## 2009-05-21 LAB — CBC WITH DIFFERENTIAL/PLATELET
BASO%: 0.3 % (ref 0.0–2.0)
EOS%: 0.9 % (ref 0.0–7.0)
MCH: 30 pg (ref 25.1–34.0)
MCHC: 33.4 g/dL (ref 31.5–36.0)
MONO#: 0.4 10*3/uL (ref 0.1–0.9)
RDW: 14.1 % (ref 11.2–14.5)
WBC: 7.3 10*3/uL (ref 3.9–10.3)
lymph#: 2.1 10*3/uL (ref 0.9–3.3)

## 2009-05-24 LAB — COMPREHENSIVE METABOLIC PANEL
ALT: 21 U/L (ref 0–35)
AST: 20 U/L (ref 0–37)
Albumin: 4.3 g/dL (ref 3.5–5.2)
Calcium: 9.8 mg/dL (ref 8.4–10.5)
Chloride: 104 mEq/L (ref 96–112)
Creatinine, Ser: 0.64 mg/dL (ref 0.40–1.20)
Potassium: 3.4 mEq/L — ABNORMAL LOW (ref 3.5–5.3)
Sodium: 143 mEq/L (ref 135–145)
Total Protein: 7.7 g/dL (ref 6.0–8.3)

## 2009-05-24 LAB — LACTATE DEHYDROGENASE: LDH: 151 U/L (ref 94–250)

## 2009-05-24 LAB — VITAMIN D 25 HYDROXY (VIT D DEFICIENCY, FRACTURES): Vit D, 25-Hydroxy: 27 ng/mL — ABNORMAL LOW (ref 30–89)

## 2009-05-24 LAB — CANCER ANTIGEN 27.29: CA 27.29: 27 U/mL (ref 0–39)

## 2009-10-21 ENCOUNTER — Ambulatory Visit: Payer: Self-pay | Admitting: Oncology

## 2009-10-25 LAB — COMPREHENSIVE METABOLIC PANEL
ALT: 15 U/L (ref 0–35)
Albumin: 4 g/dL (ref 3.5–5.2)
BUN: 21 mg/dL (ref 6–23)
Calcium: 9.6 mg/dL (ref 8.4–10.5)
Chloride: 102 mEq/L (ref 96–112)
Glucose, Bld: 87 mg/dL (ref 70–99)
Potassium: 3.9 mEq/L (ref 3.5–5.3)
Total Bilirubin: 0.2 mg/dL — ABNORMAL LOW (ref 0.3–1.2)

## 2009-10-25 LAB — CBC WITH DIFFERENTIAL/PLATELET
BASO%: 0.3 % (ref 0.0–2.0)
EOS%: 1.2 % (ref 0.0–7.0)
LYMPH%: 42.1 % (ref 14.0–49.7)
MCV: 88.7 fL (ref 79.5–101.0)
MONO#: 0.4 10*3/uL (ref 0.1–0.9)
MONO%: 6.5 % (ref 0.0–14.0)
NEUT#: 3.2 10*3/uL (ref 1.5–6.5)
RDW: 14.4 % (ref 11.2–14.5)
WBC: 6.5 10*3/uL (ref 3.9–10.3)

## 2009-10-25 LAB — LACTATE DEHYDROGENASE: LDH: 132 U/L (ref 94–250)

## 2009-10-25 LAB — CANCER ANTIGEN 27.29: CA 27.29: 21 U/mL (ref 0–39)

## 2010-04-29 ENCOUNTER — Ambulatory Visit: Payer: Self-pay | Admitting: Oncology

## 2010-05-03 LAB — CBC WITH DIFFERENTIAL/PLATELET
BASO%: 0.4 % (ref 0.0–2.0)
Basophils Absolute: 0 10*3/uL (ref 0.0–0.1)
HCT: 35.2 % (ref 34.8–46.6)
HGB: 11.7 g/dL (ref 11.6–15.9)
MCH: 29.1 pg (ref 25.1–34.0)
MCV: 87.5 fL (ref 79.5–101.0)
MONO#: 0.4 10*3/uL (ref 0.1–0.9)
NEUT%: 60.9 % (ref 38.4–76.8)
Platelets: 234 10*3/uL (ref 145–400)
RBC: 4.03 10*6/uL (ref 3.70–5.45)

## 2010-05-04 LAB — COMPREHENSIVE METABOLIC PANEL
ALT: 15 U/L (ref 0–35)
AST: 15 U/L (ref 0–37)
Albumin: 4.2 g/dL (ref 3.5–5.2)
BUN: 15 mg/dL (ref 6–23)
CO2: 29 mEq/L (ref 19–32)
Chloride: 101 mEq/L (ref 96–112)
Creatinine, Ser: 0.71 mg/dL (ref 0.40–1.20)
Potassium: 3.9 mEq/L (ref 3.5–5.3)
Sodium: 139 mEq/L (ref 135–145)

## 2010-05-04 LAB — VITAMIN D 25 HYDROXY (VIT D DEFICIENCY, FRACTURES): Vit D, 25-Hydroxy: 30 ng/mL (ref 30–89)

## 2010-05-04 LAB — LACTATE DEHYDROGENASE: LDH: 159 U/L (ref 94–250)

## 2010-06-17 ENCOUNTER — Encounter: Admission: RE | Admit: 2010-06-17 | Discharge: 2010-06-17 | Payer: Self-pay | Admitting: Otolaryngology

## 2010-06-25 HISTORY — PX: BREAST RECONSTRUCTION: SHX9

## 2010-07-18 ENCOUNTER — Ambulatory Visit (HOSPITAL_BASED_OUTPATIENT_CLINIC_OR_DEPARTMENT_OTHER): Admission: RE | Admit: 2010-07-18 | Discharge: 2010-07-18 | Payer: Self-pay | Admitting: Specialist

## 2010-10-16 ENCOUNTER — Encounter: Payer: Self-pay | Admitting: General Surgery

## 2010-10-16 ENCOUNTER — Encounter: Payer: Self-pay | Admitting: Gynecology

## 2010-10-16 ENCOUNTER — Encounter: Payer: Self-pay | Admitting: Oncology

## 2010-11-01 ENCOUNTER — Other Ambulatory Visit: Payer: Self-pay | Admitting: Oncology

## 2010-11-01 ENCOUNTER — Encounter (HOSPITAL_BASED_OUTPATIENT_CLINIC_OR_DEPARTMENT_OTHER): Payer: Medicare Other | Admitting: Oncology

## 2010-11-01 DIAGNOSIS — C50219 Malignant neoplasm of upper-inner quadrant of unspecified female breast: Secondary | ICD-10-CM

## 2010-11-01 DIAGNOSIS — C779 Secondary and unspecified malignant neoplasm of lymph node, unspecified: Secondary | ICD-10-CM

## 2010-11-01 LAB — CBC WITH DIFFERENTIAL/PLATELET
Eosinophils Absolute: 0.1 10*3/uL (ref 0.0–0.5)
LYMPH%: 36.3 % (ref 14.0–49.7)
MCH: 27.5 pg (ref 25.1–34.0)
MCHC: 32.9 g/dL (ref 31.5–36.0)
NEUT#: 3.3 10*3/uL (ref 1.5–6.5)
NEUT%: 56.7 % (ref 38.4–76.8)
Platelets: 218 10*3/uL (ref 145–400)
RDW: 15.1 % — ABNORMAL HIGH (ref 11.2–14.5)
WBC: 5.8 10*3/uL (ref 3.9–10.3)

## 2010-11-01 LAB — COMPREHENSIVE METABOLIC PANEL
Chloride: 103 mEq/L (ref 96–112)
Glucose, Bld: 87 mg/dL (ref 70–99)
Potassium: 3.9 mEq/L (ref 3.5–5.3)

## 2010-11-10 ENCOUNTER — Encounter: Payer: Medicare Other | Admitting: Oncology

## 2010-11-10 ENCOUNTER — Other Ambulatory Visit: Payer: Self-pay | Admitting: Oncology

## 2010-11-10 DIAGNOSIS — Z780 Asymptomatic menopausal state: Secondary | ICD-10-CM

## 2010-12-06 ENCOUNTER — Ambulatory Visit
Admission: RE | Admit: 2010-12-06 | Discharge: 2010-12-06 | Disposition: A | Discharge status: Home / Self Care | Payer: BC Managed Care – PPO | Source: Ambulatory Visit | Attending: Oncology | Admitting: Oncology

## 2010-12-06 DIAGNOSIS — Z780 Asymptomatic menopausal state: Secondary | ICD-10-CM

## 2010-12-07 LAB — CBC
MCV: 87.5 fL (ref 78.0–100.0)
Platelets: 223 10*3/uL (ref 150–400)
RDW: 14.4 % (ref 11.5–15.5)
WBC: 7.5 10*3/uL (ref 4.0–10.5)

## 2010-12-07 LAB — DIFFERENTIAL
Basophils Absolute: 0 10*3/uL (ref 0.0–0.1)
Eosinophils Absolute: 0.1 10*3/uL (ref 0.0–0.7)
Eosinophils Relative: 2 % (ref 0–5)
Lymphocytes Relative: 39 % (ref 12–46)

## 2010-12-07 LAB — BASIC METABOLIC PANEL
BUN: 9 mg/dL (ref 6–23)
Calcium: 9.2 mg/dL (ref 8.4–10.5)
Chloride: 103 mEq/L (ref 96–112)
Creatinine, Ser: 0.67 mg/dL (ref 0.4–1.2)

## 2010-12-07 LAB — POCT HEMOGLOBIN-HEMACUE: Hemoglobin: 12.6 g/dL (ref 12.0–15.0)

## 2011-01-02 LAB — BASIC METABOLIC PANEL
BUN: 6 mg/dL (ref 6–23)
BUN: 8 mg/dL (ref 6–23)
Calcium: 8.8 mg/dL (ref 8.4–10.5)
Chloride: 106 mEq/L (ref 96–112)
Creatinine, Ser: 0.48 mg/dL (ref 0.4–1.2)
Creatinine, Ser: 0.53 mg/dL (ref 0.4–1.2)
GFR calc Af Amer: >60 mL/min (ref >60)
GFR calc non Af Amer: >60 mL/min (ref >60)
GFR calc non Af Amer: >60 mL/min (ref >60)
Glucose, Bld: 125 mg/dL — ABNORMAL HIGH (ref 70–99)
Potassium: 3.3 mEq/L — ABNORMAL LOW (ref 3.5–5.1)
Potassium: 3.9 mEq/L (ref 3.5–5.1)
Sodium: 133 mEq/L — ABNORMAL LOW (ref 135–145)

## 2011-01-02 LAB — DIFFERENTIAL
Eosinophils Absolute: 0.1 10*3/uL (ref 0.0–0.7)
Eosinophils Relative: 1 % (ref 0–5)
Lymphs Abs: 2.1 10*3/uL (ref 0.7–4.0)
Monocytes Relative: 6 % (ref 3–12)
Neutrophils Relative %: 60 % (ref 43–77)

## 2011-01-02 LAB — TYPE AND SCREEN
ABO/RH(D): AB POS
Antibody Screen: NEGATIVE

## 2011-01-02 LAB — CBC
HCT: 35.7 % — ABNORMAL LOW (ref 36.0–46.0)
Hemoglobin: 10.5 g/dL — ABNORMAL LOW (ref 12.0–15.0)
MCV: 90.9 fL (ref 78.0–100.0)
Platelets: 150 10*3/uL (ref 150–400)
Platelets: 169 10*3/uL (ref 150–400)
RBC: 3.93 MIL/uL (ref 3.87–5.11)
RDW: 14.6 % (ref 11.5–15.5)
WBC: 6 10*3/uL (ref 4.0–10.5)
WBC: 6.6 10*3/uL (ref 4.0–10.5)

## 2011-01-02 LAB — URINALYSIS, ROUTINE W REFLEX MICROSCOPIC
Hgb urine dipstick: NEGATIVE
Specific Gravity, Urine: 1.025 (ref 1.005–1.030)
Urobilinogen, UA: 0.2 mg/dL (ref 0.0–1.0)

## 2011-01-02 LAB — PROTIME-INR: INR: 1 (ref 0.00–1.49)

## 2011-01-04 LAB — BASIC METABOLIC PANEL
BUN: 12 mg/dL (ref 6–23)
Calcium: 8.9 mg/dL (ref 8.4–10.5)
Calcium: 9.1 mg/dL (ref 8.4–10.5)
Calcium: 9.2 mg/dL (ref 8.4–10.5)
Creatinine, Ser: 0.49 mg/dL (ref 0.4–1.2)
GFR calc Af Amer: >60 mL/min (ref >60)
GFR calc Af Amer: >60 mL/min (ref >60)
GFR calc non Af Amer: >60 mL/min (ref >60)
GFR calc non Af Amer: >60 mL/min (ref >60)
GFR calc non Af Amer: >60 mL/min (ref >60)
Glucose, Bld: 104 mg/dL — ABNORMAL HIGH (ref 70–99)
Glucose, Bld: 124 mg/dL — ABNORMAL HIGH (ref 70–99)
Glucose, Bld: 141 mg/dL — ABNORMAL HIGH (ref 70–99)
Potassium: 3.6 mEq/L (ref 3.5–5.1)
Potassium: 4 mEq/L (ref 3.5–5.1)
Sodium: 136 mEq/L (ref 135–145)
Sodium: 140 mEq/L (ref 135–145)
Sodium: 141 mEq/L (ref 135–145)

## 2011-01-04 LAB — TYPE AND SCREEN
ABO/RH(D): AB POS
Antibody Screen: NEGATIVE

## 2011-01-04 LAB — CBC
HCT: 23 % — ABNORMAL LOW (ref 36.0–46.0)
HCT: 25 % — ABNORMAL LOW (ref 36.0–46.0)
Hemoglobin: 8.6 g/dL — ABNORMAL LOW (ref 12.0–15.0)
Platelets: 180 10*3/uL (ref 150–400)
RBC: 2.81 MIL/uL — ABNORMAL LOW (ref 3.87–5.11)
RDW: 16 % — ABNORMAL HIGH (ref 11.5–15.5)
WBC: 7 10*3/uL (ref 4.0–10.5)
WBC: 8.3 10*3/uL (ref 4.0–10.5)

## 2011-01-04 LAB — HEMOGLOBIN AND HEMATOCRIT, BLOOD: HCT: 23.6 % — ABNORMAL LOW (ref 36.0–46.0)

## 2011-01-05 ENCOUNTER — Encounter (HOSPITAL_BASED_OUTPATIENT_CLINIC_OR_DEPARTMENT_OTHER)
Admission: RE | Admit: 2011-01-05 | Discharge: 2011-01-05 | Disposition: A | Discharge status: Home / Self Care | Payer: Medicare Other | Source: Ambulatory Visit | Attending: Specialist | Admitting: Specialist

## 2011-01-05 LAB — BASIC METABOLIC PANEL
CO2: 31 mEq/L (ref 19–32)
CO2: 32 mEq/L (ref 19–32)
Calcium: 9.3 mg/dL (ref 8.4–10.5)
Chloride: 104 mEq/L (ref 96–112)
Chloride: 102 mEq/L (ref 96–112)
Creatinine, Ser: 0.65 mg/dL (ref 0.4–1.2)
GFR calc Af Amer: >60 mL/min (ref >60)
GFR calc Af Amer: >60 mL/min (ref >60)
Glucose, Bld: 111 mg/dL — ABNORMAL HIGH (ref 70–99)
Potassium: 3.1 mEq/L — ABNORMAL LOW (ref 3.5–5.1)
Potassium: 4.3 mEq/L (ref 3.5–5.1)
Sodium: 141 mEq/L (ref 135–145)
Sodium: 138 mEq/L (ref 135–145)

## 2011-01-05 LAB — CBC
HCT: 34.4 % — ABNORMAL LOW (ref 36.0–46.0)
HCT: 35.3 % — ABNORMAL LOW (ref 36.0–46.0)
Hemoglobin: 11.2 g/dL — ABNORMAL LOW (ref 12.0–15.0)
Hemoglobin: 11.3 g/dL — ABNORMAL LOW (ref 12.0–15.0)
MCHC: 31.7 g/dL (ref 30.0–36.0)
MCHC: 32.7 g/dL (ref 30.0–36.0)
MCV: 86.7 fL (ref 78.0–100.0)
RBC: 3.97 MIL/uL (ref 3.87–5.11)
RBC: 4.14 MIL/uL (ref 3.87–5.11)
WBC: 6.8 10*3/uL (ref 4.0–10.5)
WBC: 7.9 10*3/uL (ref 4.0–10.5)

## 2011-01-05 LAB — URINALYSIS, ROUTINE W REFLEX MICROSCOPIC
Bilirubin Urine: NEGATIVE
Nitrite: NEGATIVE
Specific Gravity, Urine: 1.017 (ref 1.005–1.030)
Urobilinogen, UA: 0.2 mg/dL (ref 0.0–1.0)
pH: 7.5 (ref 5.0–8.0)

## 2011-01-05 LAB — DIFFERENTIAL
Basophils Absolute: 0 10*3/uL (ref 0.0–0.1)
Basophils Relative: 0 % (ref 0–1)
Basophils Relative: 0 % (ref 0–1)
Eosinophils Absolute: 0.1 10*3/uL (ref 0.0–0.7)
Lymphocytes Relative: 35 % (ref 12–46)
Lymphs Abs: 2.4 10*3/uL (ref 0.7–4.0)
Monocytes Absolute: 0.4 10*3/uL (ref 0.1–1.0)
Monocytes Absolute: 0.5 10*3/uL (ref 0.1–1.0)
Monocytes Relative: 6 % (ref 3–12)
Neutro Abs: 3.8 10*3/uL (ref 1.7–7.7)
Neutro Abs: 4.5 10*3/uL (ref 1.7–7.7)
Neutrophils Relative %: 57 % (ref 43–77)

## 2011-01-05 LAB — PROTIME-INR: INR: 1 (ref 0.00–1.49)

## 2011-01-05 LAB — APTT: aPTT: 35 seconds (ref 24–37)

## 2011-01-09 ENCOUNTER — Ambulatory Visit (HOSPITAL_BASED_OUTPATIENT_CLINIC_OR_DEPARTMENT_OTHER)
Admission: RE | Admit: 2011-01-09 | Discharge: 2011-01-09 | Disposition: A | Discharge status: Home / Self Care | Payer: Medicare Other | Source: Ambulatory Visit | Attending: Specialist | Admitting: Specialist

## 2011-01-09 DIAGNOSIS — Z853 Personal history of malignant neoplasm of breast: Secondary | ICD-10-CM | POA: Insufficient documentation

## 2011-01-09 DIAGNOSIS — Z79899 Other long term (current) drug therapy: Secondary | ICD-10-CM | POA: Insufficient documentation

## 2011-01-09 DIAGNOSIS — Z01812 Encounter for preprocedural laboratory examination: Secondary | ICD-10-CM | POA: Insufficient documentation

## 2011-01-09 DIAGNOSIS — Z9010 Acquired absence of unspecified breast and nipple: Secondary | ICD-10-CM | POA: Insufficient documentation

## 2011-01-09 DIAGNOSIS — I10 Essential (primary) hypertension: Secondary | ICD-10-CM | POA: Insufficient documentation

## 2011-01-09 DIAGNOSIS — Z421 Encounter for breast reconstruction following mastectomy: Secondary | ICD-10-CM | POA: Insufficient documentation

## 2011-01-09 DIAGNOSIS — E669 Obesity, unspecified: Secondary | ICD-10-CM | POA: Insufficient documentation

## 2011-01-25 NOTE — Op Note (Signed)
NAMEROBEN, TATSCH               ACCOUNT NO.:  [id number]  MEDICAL RECORD NO.:  [id number]           PATIENT TYPE:  LOCATION:                                 FACILITY:  PHYSICIAN:  Earvin Hansen L. Careli Luzader, M.D.DATE OF BIRTH:  Jan 29, 1961  DATE OF PROCEDURE:  01/09/2011 DATE OF DISCHARGE:                              OPERATIVE REPORT   This is a 50 year old lady status post bilateral mastectomy for breast cancer has had tissue expanders on the right and left sides with good results.  She is now being prepared for right and left nipple-areolae reconstructions as well as left capsule myotomy.  Preoperatively, the patient was sat up and drawn for the midline of the chest as well as demarcation of the anterolateral lines.  She then also had drawings done of the fold area on the left inner chest region from the previous mastectomy that we have been trying to work out and with filling of the tissue expander some of that has smoothed out but still has dentated quite a bit to make a deformative scar area.  ANESTHESIA:  General.  The patient underwent general anesthesia, intubated orally.  Prep was done to the chest and breast areas in routine fashion.  Using Hibiclens soap and solution, walled off with sterile towels and drapes so as to make a sterile field.  Also, the right and left groin areas were also prepped for preparation of full-thickness skin graft, taken from that for both sides.  The nipples are going to be reconstructed with bilateral skate flaps.  After this, sterile towels and drapes were placed so as to make a sterile field.  One-quarter percent Xylocaine 1:400,000 concentration was injected locally in the right groin area for salvage of the full-thickness skin graft coverages.  The new areolae were drawn with the medicine cup up and the skate flap was then drawn out and then dissected out using a 15 blade and delicate Stevens scissors.  I was able to lift the flaps to  include fat within the nipple and close the base with multiple sutures of 5-0 nylon.  Next, the skin over the areolae was removed and de-epithelialized down the dermis.  A full-thickness skin graft composite was taken from the right groin area. The area measured approximately 12 inches across to 4 inches in width. I was able to remove that down to underlying subcutaneous tissue.  The defect then was reclosed with multiple sutures of 2-0 Monocryl x2 layers, a subdermal suture of 3-0 Monocryl, then a running subcuticular stitch of 3-0 Monocryl.  The new area was defatted, placed in saline. Next, out laterally incision was made and dissected over to the medial part of the breast.  We were using light retraction.  I was able to do a limited capsulotomy to allow the creased area to open more being careful not to come through skin.  Next, each tissue expander was inflated with 100 mL of saline.  The full-thickness skin graft was defatted and then placed over the defects and secured with a running 4-0 Prolene suture. A small opening was made to allow the nipples to come  back through with excellent symmetry compared to right and left sides.  Next, the grafts were held in place with a running 4-0 Prolene suture.  Suture left long to make a stent dressing using Xeroform, 4 x 4's, and a medicine cup was used to protect over the nipple areas to allow no pressure on those areas.  The right groin area was also dressed likewise, 4 x 4's, ABDs, Hypafix tape.  She was then taken to recovery in excellent condition.  ESTIMATED BLOOD LOSS:  Less than 100 mL.  COMPLICATIONS:  None.     Yaakov Guthrie. Shon Hough, M.D.     Cathie Hoops  D:  01/09/2011  T:  01/09/2011  Job:  [id number]  Electronically Signed by Louisa Second M.D. on 01/25/2011 04:09:36 PM

## 2011-02-07 NOTE — Op Note (Signed)
NAMEBALINDA, [name]               ACCOUNT NO.:  [id number]   MEDICAL RECORD NO.:  [id number]          PATIENT TYPE:  INP   LOCATION:  0004                         FACILITY:  [facility]   PHYSICIAN:  Madlyn Frankel. Charlann Boxer, M.D.  DATE OF BIRTH:  [birth year]   DATE OF PROCEDURE:  12/28/2008  DATE OF DISCHARGE:                               OPERATIVE REPORT   PREOPERATIVE DIAGNOSIS:  Left knee osteoarthritis.   POSTOPERATIVE DIAGNOSIS:  Left knee osteoarthritis.   PROCEDURE:  Left total knee replacement.   COMPONENTS USED:  DePuy rotating platform posterior stabilized knee  system, size 4 narrow femur, 3 tibia, 10-mm insert to match the 4 femur,  and 38 patella button.   SURGEON:  Madlyn Frankel. Charlann Boxer, M.D.   ASSISTANT:  Dwyane Luo, PA-C.   ANESTHESIA:  Duramorph spinal.   FINDINGS:  None.   SPECIMENS:  None.   BLOOD LOSS:  Minimal.   TOURNIQUET TIME:  45 minutes at 300 mmHg.   DRAINS:  One Hemovac.   COMPLICATIONS:  None.   INDICATIONS FOR THE PROCEDURE:  [name] is a 50 year old female  unfortunately with advanced degenerative changes of bilateral knees  complicated by obesity.  We have had lengthy discussions in management  over the past 2-3 years about her decreased activity and her overall  weight loss issue.  She had been seen and evaluated and has had a lap  banding and has successfully lost anywhere from 50-70 pounds.  She  currently is around the 250 pound range.  Despite the risks that I have  talked about including risk of infection that would potentially lead to  loss of limb, risks of failure due to her age, her size, etc., as well  as standard risks of DVT, component failure, need for revision surgery  including the need for manipulation, the postoperative course and  expectations, she at this point wished to proceed with knee replacement  surgery, and she actually has the right knee scheduled in May.  Risks  and benefits reviewed.  Consent was obtained for the benefit of  pain  relief.   PROCEDURE IN DETAIL:  The patient was brought to the operative theater.  Once adequate anesthesia, preoperative antibiotics, Ancef administered,  the patient was positioned supine with the left lower extremity placed  in a thigh tourniquet.   Left lower extremity was prescrubbed, prepped and draped in a sterile  fashion.  Time-out was performed, identifying the patient, planned  procedure and the extremity.   Leg was exsanguinated, tourniquet elevated to 300 mmHg, midline incision  was made.  The patient had significant adipose tissue from the skin down  to the extensor mechanism.  Extensor mechanism was identified and an  arthrotomy created medially.  Following initial debridement, attention  was directed to the patella.  Precut measurement was 22 mm and I  resected down to 14 mm and used the 38 patellar button.  We drilled the  lug holes and placed a metal shim to protect the cut surface from  retractors and saw blades.   Attention was now directed to the femur.  Following  meniscal debridement  as well as cruciate stump debridement, the femoral canal was opened with  the drill, irrigated to prevent fat emboli.  I then used an  intramedullary rod and at 3 degrees of valgus resected 12 mm of bone off  the distal femur due to at least a 10-degree flexion contracture noted  preoperatively.   At this point I subluxated the tibia anteriorly and using an  extramedullary guide I resected 10 mm of bone based off the proximal  lateral tibia.  At this point I checked with an extension block and was  happy that the knee was going to come to full extension with the 10-mm  block in place.   At this point, attention was redirected back to the femur.  I checked  the cut surface of the proximal tibia with a size 3 tibial tray and  alignment rod to confirm that the cut was perpendicular in both planes.  Once this was done, I measured the femur to be a size 4 from an anterior-   posterior dimension, but felt that it was going to be best fit from a  narrow component with the mediolateral dimension.  The rotation block  was pinned into position based off the C clamp and the proximal cut of  the tibia and I exchanged this out for the 4-in-1 cutting block.  The  anterior, posterior and chamfer cuts were then made without difficulty  nor notching.  Final box cut was made based off the lateral aspect of  the distal femur.   Attention was now redirected back to the tibia.  Prior to my final  tibial preparation, I used laminar spreaders and used an osteotome to  remove bone off the posteromedial aspect of the femur, there was not as  much on the lateral side.   The tibia was then subluxated anteriorly and with a size 3 tibial tray  which fit best on the cut surface, I pinned it in position through the  medial third of the tubercle, checked again with the alignment rod, and  then drilled and keel punched the tibia.  Trial reduction was now  carried out with a 4 narrow femur, 3 tibia and a 10-mm insert.  The knee  came to full extension, the patella tracked through the trochlea without  application of pressure, and the knee ligaments appeared to be stable  from extension to flexion.  Given these parameters, all trial components  were removed.  The knee was brought to extension.  The synovial capsule  junction was injected with 50 mL of 0.25% Marcaine with epinephrine and  1 mL of Toradol.  The knee was irrigated with normal saline solution and  any debridements carried out as necessary.  I used the smooth pin to  drill some holes into the proximal medial tibia which was very  sclerotic.  Cement was mixed and the final components opened.   Final components were cemented into position.  The knee was brought to  extension with the 10-mm insert in place.  Extruded cement was removed.  Once cement had cured, excessive cement was removed throughout the knee.  The final 10-mm  insert was placed to match the size 4  femur.  The knee  was reirrigated with normal saline solution.  The tourniquet was let  down at 45 minutes 250 mmHg.  The medium Hemovac drain was placed deep.  The extensor mechanism was then reapproximated using #1 Vicryl with the  knee in flexion.  The remaining wound was  closed with 2-0 Vicryl and staples on the skin due to the thin epidermal  layer and her size.  The knee was then dressed into a sterile dressing  with Xeroform and a bulky sterile wrap.  She was brought to the recovery  room in stable condition, tolerating the procedure well.      Madlyn Frankel Charlann Boxer, M.D.  Electronically Signed     MDO/MEDQ  D:  12/28/2008  T:  12/28/2008  Job:  [id number]

## 2011-02-07 NOTE — Discharge Summary (Signed)
NAMETALULLAH, ABATE               ACCOUNT NO.:  [id number]   MEDICAL RECORD NO.:  [id number]          PATIENT TYPE:  INP   LOCATION:  5708                         FACILITY:  [facility]   PHYSICIAN:  Angelia Mould. Derrell Lolling, M.D.DATE OF BIRTH:  11-Aug-1961   DATE OF ADMISSION:  12/04/2006  DATE OF DISCHARGE:  12/06/2006                               DISCHARGE SUMMARY   FINAL DIAGNOSES:  1. Carcinoma of the left breast, stage T2, N1A.  2. Carcinoma of the right breast, stage T1B, N1A.  3. BRCA-1 gene positive.   OPERATIONS PERFORMED:  1. Injection of blue dye, left breast.  2. Left axillary sentinel lymph node biopsy.  3. Left modified radical mastectomy.  4. Right total mastectomy.  5. Insertion of Power Port, right internal jugular vein with      fluoroscopic guidance.   DATE OF SURGERY:  December 04, 2006.   HISTORY:  This is a 50 year old black female who felt a mass in the 12  o'clock position of the left breast and imaging studies showed a 3.0-cm  mass at the 11 to 12 o'clock position high in the left breast.  Image-  guided biopsy showed invasive breast cancer which was ER/PR negative.  She has a very strong family history for breast cancer.  She underwent  genetic counseling and genetic testing, revealing that she is BRCA-1  gene positive.  She has had extensive counseling.  She has had an MRI  which showed no evidence of adenopathy, but a PET/CT suggested positive  lymph node in the left axilla, but no abnormalities on the right.  Ultimately, she has elected to have bilateral mastectomy.  Dr. Pierce Crane had seen her and recommended that a port be placed for  chemotherapy and she was admitted electively.   HOSPITAL COURSE:  On the day of admission, the patient underwent left  sentinel lymph node mapping and biopsy and one of the lymph nodes was  very suspicious for cancer and so, we went ahead with a left modified  radical mastectomy and a prophylactic right mastectomy and  insertion of  a Power Port via the right internal jugular vein.   Postoperatively, the patient did well.  We began a diet on postoperative  day #1 and she became active and ambulatory and was ready for discharge  on postoperative day #2.  At the time of discharge, her drains were  draining  moderate amounts of thin serosanguineous fluids and all the drains were  left in place.  She was given a prescription for Vicodin and asked to  return to the office the following week.  At the time of discharge, her  pathology report was not known, but this has been discussed with her as  an outpatient.      Angelia Mould. Derrell Lolling, M.D.  Electronically Signed     HMI/MEDQ  D:  02/08/2007  T:  02/08/2007  Job:  [id number]   cc:   Pam Drown, M.D.

## 2011-02-07 NOTE — Op Note (Signed)
NAME:  [name], [name]               ACCOUNT NO.:  [id number]   MEDICAL RECORD NO.:  [id number]          PATIENT TYPE:  INP   LOCATION:  1539                         FACILITY[facility]  [facility]   PHYSICIAN:  Sandria Bales. Ezzard Standing, M.D.  DATE OF BIRTH:  May 29, 1961   DATE OF PROCEDURE:  08/04/2008  DATE OF DISCHARGE:                               OPERATIVE REPORT   Date of Surgery ?   PREOPERATIVE DIAGNOSIS:  Morbid obesity (weight 291, BMI of 49.8).   POSTOPERATIVE DIAGNOSIS:  Morbid obesity (weight 291, BMI of 49.8).   PROCEDURE:  A laparoscopic band placement of AP standard band.   SURGEON:  Sandria Bales. Ezzard Standing, M.D.   ASSISTANT:  Thornton Park. Daphine Deutscher, M.D.   ANESTHESIA:  General tracheal.   ESTIMATED BLOOD LOSS:  Minimal.   PROCEDURE:  Ms. Finchum is a 50 year old black female who has been through  our bariatric program, interested in the lap band for control for morbid  obesity.  She sees Dr. Selena Batten as a primary care doctor.  She has  had history of the BRCA1 with bilateral breast cancer.  She was treated  by Dr. Claud Kelp and Dr. Pierce Crane.  She had bilateral  mastectomies in March 2008 and she had a hysterectomy and bilateral  salpingo-oophorectomy by Dr. Wyvonnia Lora in June 2008.   The indications, potential complications of lap band procedure were  explained to the patient.  Potential lap band complications include  bleeding, infection, perforation of the bowel, slippage and erosion of  the band.   OPERATIVE NOTE:  The patient was placed in the supine position.  She was  given a gram of Ancef at this procedure had PAS stockings in place and  abdomen was prepped out with HCG.  A time out was held identifying  patient andt he procedure.   I marked her abdomen and her abdominal cavity to the left upper quadrant  using a 11 mm Optiview  I then placed four additional trocars, placed a  5 mm subxiphoid, 15 mm right subcostal, 11 mm right paramedian and 11 mm  left paramedian for the  camera.  Abdominal exploration of the left lobes  of liver was unremarkable.  Anterior wall of the stomach was  unremarkable.  The bowel that I could see was unremarkable.  I then  grabbed the stomach.  I started dissection first along the angle of Hiss  on the left side of  the gastroesophageal junction and developed a  window.  I opened along the right side of the stomach, opened the  gastrohepatic ligament.  Interestingly, she had of free opening to her  lesser sac.  Her left gastric artery was fairly prominent also, so I  went immediately above the left gastric artery and opened the pars  flaccida just above or anterior to the right crus where behind the  gastroesophageal junction and passed a finger.  She had a very thick  left main crus.  She had somewhat thick right main crus.  There is no  hiatal hernia seen on the preop x-ray.  With our balloon test, we  insufflated the balloon in the stomach to 15 cm.  It was able to be  pulled through her hiatus.   She really had a fairly thin tissue around her gastroesophageal junction  without any obvious hiatal hernia on upper GI.  She was not being  treated despite the positive balloon test. I really felt that trying to  dissect out to try to repair any hiatal hernia would be more dissection  than she needed.  I thought she would be best served just with the band  placed.   I passed the band around using the finger dissector underneath the  gastroesophageal junction and passed the tubing for an APS band.  I then  cinched the APS band down over our sizing tube.  It was not too tight.  The sizing tube was then removed.   I then imbricated the stomach over the band, going lateral to the  buckle,  and I placed 3 sutures from  inferior or below the band to  above the band and this made the band lay flat.  I did put anti-  obstruction stitch, I put a fourth stitch immediately below the band to  try to prevent any slippage.  I used 0 Ethibond  sutures for these  sutures.   The band then lay flat.  I do not see any evidence of  damage to the  stomach.  I thought it was in good position.  I then removed the tubing  out through the right paramedian incision.  I removed the liver  retractor as well as the other trocars under direct visualization.   The band and the tubing was brought through the right paramedian  incision.  I enlarged the incision.  I attached the rest of the tubing  to the reservoir device and sewed in place with  4-0 Prolene sutures in  interrupted fashion.  The band port  lay flat against the fascia.  There  was no muscle or mass around it.   I then placed a 3-0 suture under the skin and I placed and closed each  site with a 5-0 Monocryl suture and painted the wounds with tincture  Benzoin and steri-stripped it.   The patient tolerated the procedure well and was transported to the  recovery room in good condition.  Sponge and needle counts were correct.      Sandria Bales. Ezzard Standing, M.D.  Electronically Signed     DHN/MEDQ  D:  08/04/2008  T:  08/04/2008  Job:  [id number]   cc:   Pam Drown, M.D.  Fax: [phone]   Pierce Crane, MD  Fax: [phone]   Madlyn Frankel. Charlann Boxer, M.D.  Fax: [phone]   Randye Lobo, M.D.  Fax: [phone]

## 2011-02-07 NOTE — Discharge Summary (Signed)
NAMEWILLER, [name]               ACCOUNT NO.:  [id number]   MEDICAL RECORD NO.:  [id number]          PATIENT TYPE:  INP   LOCATION:  URG                          FACILITY:  [facility]   PHYSICIAN:  Madlyn Frankel. Charlann Boxer, M.D.  DATE OF BIRTH:  17-Jan-1961   DATE OF ADMISSION:  03/08/2009  DATE OF DISCHARGE:  03/11/2009                               DISCHARGE SUMMARY   ADMITTING DIAGNOSES:  1. Osteoarthritis.  2. Breast cancer.  3. Hysterectomy.  4. History of left total knee replacement.  5. History of lap banding.   DISCHARGE DIAGNOSES:  1. Osteoarthritis.  2. History of breast cancer.  3. Hysterectomy.  4. Left total knee replacement.  5. Lap banding.   HISTORY OF PRESENT ILLNESS:  A 50 year old female with a history of  bilateral knee advanced osteoarthritis.  She had recent history of left  total knee replacement and has done well.  Right knee was refractory to  all conservative treatment.  She was admitted for a right total knee  replacement.   CONSULTATION:  None.   PROCEDURE:  Right total knee replacement by surgeon Dr. Durene Romans.  Assistant, Environmental health practitioner PA-C.   LABORATORY DATA:  CBC:  Final readings:  White blood cell count of 6,  hemoglobin 9.5, hematocrit 28, platelets 150.  Metabolic:  Sodium 137,  potassium was 3.3, BUN 6, creatinine 0.48 and glucose 137.   HOSPITAL COURSE:  The patient was admitted to the hospital and underwent  left total knee replacement, admitted to the orthopedic floor.  Her stay  was unremarkable.  She remained hemodynamically orthopedically stable  throughout her course of stay.  She remained neurovascularly intact of  her right lower extremity throughout her course of stay with improving  quad function.  Dressing was changed on day #2 with no significant  drainage from wound.  Physical therapy started.  She was weightbearing  as tolerated.  Made moderate progress with physical therapy.  By day #3,  she had met all criteria for discharge to  skilled nursing facility rehab  for further progress.   DISCHARGE DISPOSITION:  Discharged to skilled nursing facility rehab in  stable and improved condition.   DISCHARGE INSTRUCTIONS:  1. Diet.  Heart-healthy.  2. Wound care.  Keep wound dry.   DISCHARGE MEDICATIONS:  1. Lovenox 40 mg subcu q. 24 times 11 days.  2. Robaxin 5 mg p.o. q. six p.r.n. muscle spasm pain.  3. Norco 7.5/325 one to two p.o. q. 4-6 p.r.n. pain.  4. Celebrex 200 mg one p.o. b.i.d.  5. Iron 325 mg one p.o. b.i.d.  6. Benicar 20 mg one p.o. q.a.m.  7. HCTZ 25 mg half tab in the morning.  8. Multivitamin daily.  9. Vitamin B12 500 mcg daily.  10.Calcium 600 plus vitamin D 400 p.o. b.i.d.  11.Colace 100 mg p.o. b.i.d. p.r.n.  12.MiraLax 17 grams p.o. daily p.r.n.   DISCHARGE FOLLOWUP:  Followup with Dr. Charlann Boxer at phone number [phone] in  two weeks for wound check.     ______________________________  Yetta Glassman. [address], [place]      Madlyn Frankel.  Charlann Boxer, M.D.  Electronically Signed    BLM/MEDQ  D:  03/11/2009  T:  03/11/2009  Job:  [id number]   cc:   Pierce Crane, MD  Fax: [phone]

## 2011-02-07 NOTE — Op Note (Signed)
NAME:  [name], [name]               ACCOUNT NO.:  [id number]   MEDICAL RECORD NO.:  [id number]          PATIENT TYPE:  AMB   LOCATION:  DAY                          FACILITY:  [facility]   PHYSICIAN:  Angelia Mould. Derrell Lolling, M.D.DATE OF BIRTH:  [birth year]   DATE OF PROCEDURE:  03/11/2007  DATE OF DISCHARGE:                               OPERATIVE REPORT   PREOPERATIVE DIAGNOSES:  Breast cancer, status post bilateral  mastectomy, BRCA1 genetic mutation   POSTOPERATIVE DIAGNOSES:  Breast cancer, status post bilateral  mastectomy, BRCA1 genetic mutation   OPERATION PERFORMED:  Right axillary lymph node dissection.   SURGEON:  Angelia Mould. Derrell Lolling, M.D.   OPERATIVE INDICATIONS:  This is a 50 year old female who was found to  have cancer of the left breast.  She had a strong family history for  breast cancer.  After lengthy discussion and genetic counseling, she  underwent left modified radical mastectomy and right prophylactic  mastectomy and insertion of Power Port on 12/04/2006.  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  On the right, we were  surprised to find that there were 2 out of 5 lymph nodes positive for  metastatic breast cancer and then on further evaluation of the right  breast, they found 6-mm invasive ductal carcinoma.  She has been found  to have BRCA1 genetic mutation.  She has been discussed in breast  multidisciplinary conference.  Consensus was to proceed with a right  axillary lymph node dissection and bilateral oophorectomy.  She has been  counseled regarding this as an outpatient.  I have brought to the  hospital for right axillary lymph node dissection and following that  procedure, Dr. Conley Simmonds will perform a hysterectomy and bilateral  salpingo-oophorectomy.   OPERATIVE TECHNIQUE:  The patient was brought to the operating room.  The patient was identified as the correct  patient, correct procedure,  correct site.  General anesthesia was induced.  The right chest, right  axilla and chest wall were prepped and draped in sterile fashion.  Transverse incision was made at the hairline.  Dissection was carried  down through the subcutaneous tissue until we identified the lateral  border of the pectoralis major muscle.  We dissected the clavipectoral  fascia away from the lateral border of the pectoralis major muscle and  then continued dissection down to the lateral border of pectoralis minor  muscle and entered the axilla.  Carefully took the dissection up until  we identified the right axillary vein.  Some superficial venous  tributaries and some superficial sensory nerves were controlled between  metal clips and divided.  The thoracodorsal neurovascular bundle was  identified and preserved.  We took all the axillary contents out under  the axillary vein.  A couple of lymph nodes felt enlarged. We swept all  this out inferiorly, preserving the thoracodorsal and the long thoracic  nerves.  We continued the dissection inferiorly and took the specimen  out as a single packet and sent it for routine histology.  Hemostasis  was excellent, had been achieved with electrocautery and metal clips.   The wound was irrigated with saline.  A 19-French Blake drain was placed  in the right axilla, brought out through separate stab incision  inferiorly, sutured to the skin with nylon suture and connected to  suction bulb.  The subcutaneous tissue was closed with interrupted  sutures of 3-0 Vicryl.  Skin closed with skin staples.  Clean bandages  were placed.  The patient taken to recovery room in stable condition.   ESTIMATED BLOOD LOSS:  About 15 mL.   COMPLICATIONS:  None.   Sponge, instrument and needle counts were correct.      Angelia Mould. Derrell Lolling, M.D.  Electronically Signed     HMI/MEDQ  D:  03/11/2007  T:  03/11/2007  Job:  [id number]   cc:   Pierce Crane,  M.D.  Fax: [phone]   Pam Drown, M.D.  Fax: [phone]

## 2011-02-07 NOTE — H&P (Signed)
[name], [name]               ACCOUNT NO.:  [id number]   MEDICAL RECORD NO.:  [id number]          PATIENT TYPE:  INP   LOCATION:                               FACILITY:  [facility]   PHYSICIAN:  Madlyn Frankel. Charlann Boxer, M.D.  DATE OF BIRTH:  [birth year]   DATE OF ADMISSION:  12/28/2008  DATE OF DISCHARGE:                              HISTORY & PHYSICAL   Correction to dictation [phone].  The primary care physician is Dr. Gweneth Dimitri.   SPECIALTY PHYSICIANS:  Include Dr. Ovidio Kin and Dr. Pierce Crane.   Any reference to primary care physician should state to Dr. Gweneth Dimitri.     ______________________________  Yetta Glassman. [address], [place]      Madlyn Frankel. Charlann Boxer, M.D.  Electronically Signed    BLM/MEDQ  D:  12/07/2008  T:  12/07/2008  Job:  [id number]

## 2011-02-07 NOTE — Op Note (Signed)
NAMEDOROTHEY, OETKEN               ACCOUNT NO.:  [id number]   MEDICAL RECORD NO.:  [id number]          PATIENT TYPE:  INP   LOCATION:  1613                         FACILITY:  [facility]   PHYSICIAN:  Madlyn Frankel. Charlann Boxer, M.D.  DATE OF BIRTH:  [birth year]   DATE OF PROCEDURE:  03/08/2009  DATE OF DISCHARGE:                               OPERATIVE REPORT   PREOPERATIVE DIAGNOSIS:  Right knee osteoarthritis.   POSTOPERATIVE DIAGNOSIS:  Right knee osteoarthritis.   PROCEDURE:  Right total knee replacement utilizing DePuy components,  posterior stabilized rotating platform with a size 3 femur, 3 tibia, a  12.5 insert and a 38 patellar button.   SURGEON:  Madlyn Frankel. Charlann Boxer, M.D.   ASSISTANT:  Dwyane Luo, PA-C   ANESTHESIA:  Spinal.   DRAINS:  One Hemovac.   TOURNIQUET TIME:  44 minutes at 300 mmHg.   COMPLICATIONS:  None.   SPECIMENS:  None.   INDICATIONS FOR PROCEDURE:  Ms. Vandervort is a 50 year old female patient of  mine for bilateral knee osteoarthritis.  She had failed conservative  measures and already undergone a left total knee replacement that she  had been very pleased with.  We had discussed staged procedures and she  is subsequently ready to have this right knee done.  Risks and benefits  were reviewed.  Consent was obtained for the benefit of pain relief  after reviewing the risks of infection, DVT, component failure, need for  revision surgery.   PROCEDURE IN DETAIL:  The patient was brought to operative theater.  Once adequate anesthesia,preoperative antibiotics, Ancef, administered,  the patient was positioned supine with a proximal thigh tourniquet  placed.  The right lower extremity pre scrubbed, prepped and draped in  sterile fashion.  Time-out was performed, identifying the patient,  planned procedure and extremity.   The right leg was placed into a DeMayo foot holder for positioning.  The  leg was exsanguinated, tourniquet elevated to 300 mmHg.  A midline  incision was  made.  Sharp dissection was carried down through a  significant amount of adipose tissue.  Extensor mechanism was identified  and a median arthrotomy carried out.   Following initial debridement, revealing what appeared to be an  inflammatory arthritic condition to the joint with pretty significant  destruction of the joint surfaces, attention was directed to patella,  precut measurement was 22 mm.  I resected down to 13 and used a 38  patellar button which covered the cut surface the best.   Lug holes were drilled and a metal shim was placed to protect the cut  surface of the patella from retractors and the saw blades.   Attention now was directed to the femur.  Femoral canal was opened with  a drill, irrigated to prevent fat emboli.  Intramedullary rod was passed  and at 3 degrees of valgus based on the contralateral lower extremity,  I resected 11 mm of bone off the distal femur due to a 5 degrees flexion  contracture.   The tibia was then subluxated anteriorly following proximal medial peel.  An extramedullary guide was utilized and  I resected 10 mm of bone off  the proximal lateral tibia.  I checked the cut surface to make sure it  was perpendicular in the medial and lateral plane.  I then checked to  make sure the knee was going to come to at least extension with 10 mm  block in place.   Based on the proximal tibia cut which was perpendicular, I sized the  femur and determined it would be a size 3.  I then set the rotation  based off the proximal cut using a C-clamp.   Once the rotation block was pinned into place, I exchanged this out for  the size 3 four in 1 cutting block.  The anterior, posterior and chamfer  cuts made without difficulty nor complicating notch issues.   Following this, the final box cut was made off the lateral aspect of the  distal femur, tibia was then subluxated anteriorly and a size 3 tibial  tray fit best on the cut surface of proximal tibia.  It  was pinned into  position, drilled and keel punched.  Trial reduction now carried out  with a 12.5 insert.  The knee came out to full extension and was stable  from extension to flexion.  The patella tracked through the trochlea  without application of pressure.   At this point all trial components were removed.  The synovial capsule  junction was injected with 0.25% Marcaine with epinephrine 1 mL with  Toradol for total 61 mL. the knee was irrigated normal saline solution  pulse lavage.  Final components were opened and cement mixed.  The final  components were cemented into position.  The knee was brought to  extension with 12.5 insert.  Extruded cement was removed.  Once cement  had cured and excessive cement was removed throughout the knee, the  final 12.5 insert was placed.   The knee was reirrigated with normal saline solution.  Tourniquet had  been let down at 44 minutes.  A medium Hemovac drain placed deep.  Extensor mechanism was then reapproximated using #1 Vicryl with the knee  in flexion.  The remainder of the wound was closed with 2-0 Vicryl and  staples on the skin which I had done on the contralateral knee due to  significant size of her leg and adipose tissue and concern for wound  complication.  The wound was then dressed into a sterile bulky Jones  dressing.  She is brought to recovery room in stable condition.      Madlyn Frankel Charlann Boxer, M.D.  Electronically Signed     MDO/MEDQ  D:  03/08/2009  T:  03/09/2009  Job:  [id number]

## 2011-02-07 NOTE — Op Note (Signed)
[name], [name]               ACCOUNT NO.:  [id number]   MEDICAL RECORD NO.:  [id number]         PATIENT TYPE:  HAMB   LOCATION:                               FACILITY:  [facility]   PHYSICIAN:  Angelia Mould. Derrell Lolling, M.D.DATE OF BIRTH:  1960-12-14   DATE OF PROCEDURE:  06/08/2008  DATE OF DISCHARGE:                               OPERATIVE REPORT   PREOPERATIVE DIAGNOSIS:  Breast cancer.   POSTOPERATIVE DIAGNOSIS:  Breast cancer.   OPERATION PERFORMED:  Removal of Port-A-Cath.   SURGEON:  Angelia Mould. Derrell Lolling, MD   OPERATIVE INDICATIONS:  This is a 50 year old woman who underwent  bilateral mastectomies on December 04, 2007.  She had invasive ductal  carcinoma of the left breast, stage T2, N1a, and invasive cancer of the  right breast, stage T1b, N1a, receptor negative (one gene positive).  She has done well following that surgery.  She was referred back to me  by Dr. Pierce Crane for removal of the Port-A-Cath.  She has been  counseled as an outpatient regarding this.   OPERATIVE TECHNIQUE:  The patient requested sedation for this procedure.  After IV access was performed, she was taken to operating room.  The  right neck and right anterior chest were prepped and draped in the  sterile fashion.  The patient was identified as correct patient, correct  procedure, and correct site.  Xylocaine 1% with epinephrine was used as  a local infiltration anesthetic.  A transverse incision was made in the  right infraclavicular area, through the previous scar.  Dissection was  carried down through the subcutaneous tissue until I identified the  capsule around the [employer].  I dissected all the capsule away from  the port, divided the Prolene sutures, and then could be easily withdraw  the port and the catheter intact.  These were discarded.  There was no  bleeding.  The wound was irrigated with saline.  Subcutaneous tissue was  closed with interrupted sutures of 3-0 Vicryl.  The skin was closed  with  a running subcuticular suture of 4-0 Monocryl and Dermabond.  Clean  bandages were placed and the patient was taken to recovery room in  stable condition.  Estimated blood loss was about 3 mL.  Complications  none.  Sponge, needle, and instrument counts were correct.      Angelia Mould. Derrell Lolling, M.D.  Electronically Signed     HMI/MEDQ  D:  06/08/2008  T:  06/08/2008  Job:  [id number]   cc:   Pierce Crane, MD  Pam Drown, M.D.

## 2011-02-07 NOTE — H&P (Signed)
NAMECAMALA, [name]               ACCOUNT NO.:  [id number]   MEDICAL RECORD NO.:  [id number]          PATIENT TYPE:  INP   LOCATION:  NA                           FACILITY:  [facility]   PHYSICIAN:  Madlyn Frankel. Charlann Boxer, M.D.  DATE OF BIRTH:  1961-04-23   DATE OF ADMISSION:  03/08/2009  DATE OF DISCHARGE:                              HISTORY & PHYSICAL   ADMISSION DIAGNOSIS:  Right knee osteoarthritis.   SECONDARY DIAGNOSES:  1. History of breast cancer.  2. Hysterectomy.  3. History of left total knee replacement.  4. History of lap banding.   BRIEF ADMITTING HISTORY:  [name] is a 50 year old female known to me  for bilateral knee advanced osteoarthritis.  We had been  managing this  unsuccessfully from a conservative standpoint but she was overweight.  She had successfully undergone a lap banding procedure by Dr. Ovidio Kin and was losing weight and progressing in a fashion that I thought  was appropriate to start managing this from a more definitive  standpoint.   We had a lengthy discussion prior to her first surgery in order to get  her knee replaced and she has done very well with this and is ready at  this point to proceed with the other side.   The risks and benefits have been reviewed in the office prior to  admission.   PAST MEDICAL HISTORY:  Includes:  1. Osteoarthritis.  2. History of breast cancer.  3. Hypertension.   PAST SURGICAL HISTORY:  Includes Cesarean section, double mastectomy,  hysterectomy, lap band surgery, some ear surgery where a plate was  placed in addition to her left total knee replacement in March of this  year.   PRIMARY CARE PHYSICIAN:  Dr. Donnie Coffin.  She is seeing Dr. Ovidio Kin from  a surgical standpoint.   FAMILY MEDICAL HISTORY:  Includes cancer.   SOCIAL HISTORY:  She denies smoking.  She did stay at a rehab after her  last surgery but was not happy with that facility and is interested in  going to [facility].   MEDICATIONS:   Include Benicar, hydrochlorothiazide, vitamin B12,  Caltrate, multivitamin, Indocin, vegetable laxative, Celebrex.   REVIEW OF SYSTEMS:  Otherwise noncontributory.   PHYSICAL EXAMINATION:  VITAL SIGNS:  Examination in the office revealed  a pulse of 80, respirations 20, blood pressure 99/70.  GENERAL:  She was awake, alert and oriented in no acute distress.  CHEST:  Revealed she was clear to auscultation bilaterally.  HEART:  Had an S1 and S2 that was distinct without murmurs detected.  ABDOMEN:  Soft, nontender.  EXTREMITIES:  Reveal that her left knee incision at this point is very  well healed with good range of motion with good strength.  As for her  right knee, she has painful crepitation with no significant swelling, no  signs of infection.  Otherwise, she has no evidence of any cellulitic  change at this point and she is neurovascularly stable.   LABORATORY DATA:  She is planning to go to [facility] for her  preoperative work up including EKG  and labs.  This will be deferred and  available in the [facility] system for details.   Radiographs in our office reveal that she has advanced right knee  osteoarthritis, status post left total knee replacement, doing well.   ASSESSMENT:  Advanced right knee osteoarthritis.   PLAN:  At this point, she will be admitted for same day surgery at  [facility] on March 08, 2009.   She will then plan to go to [facility].  She has already been in  communication with them and does have a backup plan if this does not  work out.  We will plan on using oxycodone in the postoperative period  as that is what we used the last time.  She will use Lovenox for 14 days  and then switch to aspirin.  Questions were encouraged, answered and  reviewed regarding her procedure, risks and benefits, etc.      Madlyn Frankel. Charlann Boxer, M.D.  Electronically Signed     MDO/MEDQ  D:  03/05/2009  T:  03/05/2009  Job:  [id number]

## 2011-02-07 NOTE — Op Note (Signed)
NAME:  [name], [name]               ACCOUNT NO.:  [id number]   MEDICAL RECORD NO.:  [id number]          PATIENT TYPE:  AMB   LOCATION:  DAY                          FACILITY:  [facility]   PHYSICIAN:  Randye Lobo, M.D.   DATE OF BIRTH:  11-13-1960   DATE OF PROCEDURE:  03/11/2007  DATE OF DISCHARGE:                               OPERATIVE REPORT   PREOPERATIVE DIAGNOSIS:  1. BRCA1 positive status.  2. Bilateral breast cancer.   POSTOPERATIVE DIAGNOSIS:  1. BRCA1 positive status.  2. Bilateral breast cancer.  3. Pelvic and abdominal adhesions.   PROCEDURE:  Total abdominal hysterectomy, bilateral salpingo-  oophorectomy, lysis of adhesions.   SURGEON:  Conley Simmonds, M.D.   ASSISTANT:  Lodema Hong, M.D.   ANESTHESIA:  General endotracheal.   IV FLUIDS:  2500 mL Ringer's lactate.   ESTIMATED BLOOD LOSS:  200 mL   URINE OUTPUT:  1200 mL   COMPLICATIONS:  None.   INDICATIONS FOR PROCEDURE:  The patient is a 50 year old para 2, African  American female with a history of bilateral breast cancer, status post  bilateral mastectomies, who is known to have a positive BRCA1 gene  status.  The patient is currently planning for a right axillary  lymphadenectomy with Dr. Derrell Lolling and she requests removal of tubes,  ovaries and uterus during the same surgical procedure.  The patient was  initially seen by Dr. Teodora Medici who ordered a pelvic ultrasound on  November 08, 2006 documenting an endometrium measuring 1.5 cm along with  a left ovary measuring 2.8 cm and the presence of sub-centimeter  follicle.  The right ovary measured 3.4 cm and had a collapsing corpus  luteum cyst.  Dr. Chevis Pretty has referred the patient for gynecologic  surgical care and a plan is made now to proceed with a total abdominal  hysterectomy with bilateral salpingo-oophorectomy after risks, benefits,  alternatives are reviewed.   FINDINGS:  At the time of laparotomy, the patient was noted to have  omental  adhesions to the peritoneum underlying the umbilicus and also  underlying the left anterior lower abdominal wall.  The right ovary was  densely adherent to the posterior uterine fundus.  There were small  subserosal fibroids appreciated in the uterine fundus which measured  between 0.5-1.0 cm.  There was a 1.5 cm corpus luteum cyst of the left  ovary.  The right ovary appeared to be normal.  The fallopian tubes were  consistent with a prior bilateral tubal ligation.  There was fairly  extensive scarring somewhat obliterating the posterior cul-de-sac which  was consistent with a history of prior endometriosis.  The appendix was  normal.  The gallbladder, liver, diaphragm, and right kidney were  palpably normal.  The left kidney was not easily palpable.  The  periaortic region demonstrated no obvious adenopathy.  There was a small  amount of free fluid located in the pelvis.  There were no palpable  excrescences nor papillations throughout the abdomen nor the pelvis.   SPECIMEN:  The pelvic washings were sent to pathology separately from  the uterus, tubes, ovaries and  cervix.   PROCEDURE:  The patient was reidentified in the preoperative hold area.  She did receive Ancef 1 gram IV for antibiotic prophylaxis.  The patient  received PAS stockings for DVT prophylaxis.   The patient was brought to the operating room by Dr. Derrell Lolling who  performed the right axillary adenectomy.  Please refer to this dictation  separately.   I encountered the patient under general anesthesia.  Her abdomen and  vagina were sterilely prepped and a Foley catheter was sterilely placed  inside the bladder.  She was then sterilely draped.   A vertical midline incision was created along the line of the patient's  previous vertical midline incisions.  The incision was carried down to  the fascia using sharp dissection with a scalpel and monopolar cautery  to create hemostasis.  The fascia was then incised vertically  with a  scalpel.  The peritoneal cavity was entered directly as the abdominal  wall was noted to be thin.  The peritoneal incision was then extended  cranially and caudally using sharp dissection.   The lysis of the omental adhesions to the abdominal wall was performed  with monopolar cautery to create good hemostasis.   The self-retaining retractor was then placed inside the pelvis and the  bowel was packed into the upper abdomen using moistened lap pads.  Pelvic washings were performed at this time and sent to pathology.  Lysis of adhesions of the right ovary from the uterine fundus was  performed with a combination of sharp and blunt dissection.   Kelly clamps were then placed across the adnexal structures bilaterally.  The left round ligament was identified and was ligated with a  transfixing suture of 0 Vicryl.  It was then divided with monopolar  cautery.  The anterior and posterior leaves of the broad ligament were  then opened using monopolar cautery.  The left ureter was identified.  The left infundibulopelvic ligament was then doubly clamped, sharply  divided, and suture ligated with a free tie of 0 Vicryl followed by  suture ligature of the same.  The same procedure that was performed on  the left round ligament and the left infundibulopelvic ligament was then  repeated on the right-hand side.  Again the right ureter was identified  prior to clamping and dividing the infundibulopelvic ligament on the  patient's right-hand side.   The bladder flap was then sharply taken down with a combination of  monopolar cautery and Metzenbaum scissors.  The uterine arteries were  skeletonized bilaterally.  Each of the uterine arteries were then  clamped, sharply divided, and suture ligated with 0 Vicryl suture.  The  inferior aspects of the uterine arteries were then clamped, sharply  divided, and suture ligated with 0 Vicryl bilaterally.  At this point  the pelvis was noted to be quite  deep and there was limited access to  the cul-de-sac and so the uterine fundus was amputated from the cervix  and was set aside.  The partial obliteration of the posterior cul-de-sac  was encountered and the rectovaginal septum was therefore dissected out  using sharp dissection.  The cervix was noted to be quite long and the  remaining cardinal ligaments need to be clamped, sharply divided, and  suture ligated with transfixing sutures of 0 Vicryl.  The uterosacral  ligaments were then clamped and then sharply divided and suture ligated  with transfixing sutures of 0 Vicryl.  Entry was possible into the  vagina on the patient's right-hand  side. At this time and a Jorgenson  scissors was used to circumscribe the cervix from the surrounding  vagina.  The cervix was then sent to pathology along with the uterus,  tubes and ovaries.   The vaginal cuff was closed at this time.  An angle suture was created  on the patient's left-hand side using a suture of 0 Vicryl.  The  remaining vaginal cuff was then closed with figure-of-eight sutures of 0  Vicryl.   The pelvis was then irrigated and suctioned.  Hemostasis in the  rectovaginal septum was created with monopolar cautery on the posterior  aspect of the vagina.  This provided good hemostasis.  All of the  operative pedicles were examined at this time and were noted to be  hemostatic.   The abdomen was closed at this time.  A mass closure of 0 PDS was used  to close the peritoneum muscle and the fascia.  The subcutaneous tissue  was undermined in order to reduce with an abdominal wall retraction  along the vertical midline incision.  This was done using monopolar  cautery which also created good hemostasis.  The subcutaneous layer was  then irrigated and suctioned and noted to be hemostatic.  The  subcutaneous layer was closed with interrupted sutures of 2-0 plain gut  suture.  The skin was closed with staples and the pressure bandage was   placed over this.   This concluded the patient's procedure.  There were no complications.  All needle, instrument, sponge counts were correct.  The patient is  escorted to the recovery room in stable and awake condition.      Randye Lobo, M.D.  Electronically Signed     BES/MEDQ  D:  03/11/2007  T:  03/11/2007  Job:  [id number]   cc:   Leatha Gilding. Mezer, M.D.  Fax: [phone]   Angelia Mould. Derrell Lolling, M.D.  [address], [address]  [place]  [place] [address]   Pierce Crane, M.D.  Fax: [phone]   Pam Drown, M.D.  Fax: [phone]

## 2011-02-07 NOTE — Discharge Summary (Signed)
[name], [name]               ACCOUNT NO.:  [id number]   MEDICAL RECORD NO.:  [id number]          PATIENT TYPE:  INP   LOCATION:  1602                         FACILITY:  [facility]   PHYSICIAN:  Madlyn Frankel. Charlann Boxer, M.D.  DATE OF BIRTH:  [birth year]   DATE OF ADMISSION:  12/28/2008  DATE OF DISCHARGE:  01/01/2009                               DISCHARGE SUMMARY   ADMITTING DIAGNOSES:  1. Osteoarthritis.  2. Breast cancer.  3. Hypertension.   DISCHARGE DIAGNOSES:  1. Osteoarthritis.  2. Breast cancer.  3. Hypertension.  4. Postoperative acute blood loss anemia treated with transfusions.   HISTORY OF PRESENT ILLNESS:  This is a 50 year old female with a history  of left knee pain secondary to osteoarthritis.  Refractory to all  conservative treatment.   PROCEDURE:  Left total knee replaced by surgeon Dr. Durene Romans.  Assistant Dwyane Luo, PA-C.   LABORATORY DATA:  CBC final reading:  White blood cells of 7, hemoglobin  7.9, hematocrit 23.6, platelets 139.  She was transfused 2 units.  Metabolic panel:  Sodium 141, potassium 3.6, BUN 5, creatinine 0.51,  glucose 141.   HOSPITAL COURSE:  The patient was admitted to the hospital and underwent  a left total knee replacement and admitted to the orthopedic floor.  Hemodynamically, she did have acute blood loss anemia and was transfused  several units of blood.  By day 4 she was stable and not orthostatic.  Orthopedically, she did well.  She made good progress with weightbearing  as tolerated status, she was able to ambulate at least 50 feet.  Her  wound dressing was changed.  No significant drainage from the wound.  She had staples, no sign of infection, minimal redness.  She remained  neurovascular intact of her left lower extremity throughout with  improving quad function, although unable to do an independent straight  leg raise.  Seen on the 9th, she was stable, ready to go to skilled  nursing facility rehab in stable and improved  condition.   DISCHARGE DISPOSITION:  Discharged to skilled nurse facility rehab in  stable and improved condition.   DISCHARGE PHYSICAL THERAPY:  Weightbearing as tolerated with use of  rolling walker.  Goals of physical therapy will be range of motion 0-  120, quad strength 4/5.  Maintain strict precautions without anything  underneath her knees, needs to work on full extension, as she previously  had a flexion contracture.   DISCHARGE DIET:  Heart-healthy.   DISCHARGE WOUND CARE:  Keep dry.   DISCHARGE MEDICATIONS:  1. Lovenox 40 mg subcu q. 24 x10 days.  2. Enteric-coated aspirin 325 mg 1 p.o. daily x4 weeks after the      Lovenox is completed.  3. Robaxin 500 mg 1 p.o. q. 5 p.r.n. muscle spasm pain.  4. Iron 325 mg 1 p.o. t.i.d. x3 weeks.  5. Colace 100 mg 1 p.o. b.i.d. p.r.n. constipation.  6. MiraLax 17 g 1 p.o. daily p.r.n. constipation.  7. Celebrex 200 mg 1 p.o. b.i.d. x2 weeks after surgery.  8. Oxycodone 5 mg 1-3 p.o. q. 3-4 p.r.n. pain.  9. Tylenol 650 mg 1 p.o. q. 6 p.r.n. pain.  10.Benicar 20 mg 1 p.o. q.a.m.  11.HCTZ 25 mg 1 p.o. q.a.m.  12.Multivitamin p.o. daily.  13.B12 500 mcg 1 daily.  14.Calcium plus vitamin D 600/400 one b.i.d.   DISCHARGE FOLLOWUP:  Follow up with Dr. Charlann Boxer at phone number [phone] in  2 weeks for wound check and staple removal.     ______________________________  Yetta Glassman. [address], [place]      Madlyn Frankel. Charlann Boxer, M.D.  Electronically Signed    BLM/MEDQ  D:  01/01/2009  T:  01/01/2009  Job:  [id number]   cc:   Pam Drown, M.D.  Fax: [phone]   Pierce Crane, MD  Fax: [phone]

## 2011-02-10 NOTE — Op Note (Signed)
NAME:  [name], [name]                        ACCOUNT NO.:  [id number]   MEDICAL RECORD NO.:  [id number]                   PATIENT TYPE:  AMB   LOCATION:  ENDO                                 FACILITY:  [facility]   PHYSICIAN:  Petra Kuba, M.D.                 DATE OF BIRTH:  [birth year]   DATE OF PROCEDURE:  DATE OF DISCHARGE:                                 OPERATIVE REPORT   PROCEDURE:  Esophagogastroduodenoscopy with biopsy.   INDICATIONS FOR PROCEDURE:  Iron deficiency, nondiagnostic colonoscopy.   Consent was signed after risks, benefits, methods, and options were  thoroughly discussed prior to any premeds given and in the office.   ADDITIONAL MEDICINES FOR THIS PROCEDURE:  Versed 3.   DESCRIPTION OF PROCEDURE:  The video endoscope was inserted by direct  vision. The esophagus was normal. She did have a small hiatal hernia. The  scope passed into the stomach, advanced through a normal antrum, normal  pylorus into a normal duodenal bulb and around the C loop to a normal second  portion of the duodenum. A few duodenal biopsies were obtained to rule out  any malabsorption. The scope was slowly withdrawn back to the bulb which  again confirmed its normal appearance. The scope was withdrawn back to the  stomach which was evaluated on retroflexion and straight visualization and  other than the hiatal hernia being confirmed in the cardia, the fundus,  angularis, lesser and greater curve were all normal. Air was suctioned,  scope slowly withdrawn and again a good look at the esophagus was normal.  The scope was removed. The patient tolerated the procedure well. There was  no obvious or immediate complication.   ENDOSCOPIC DIAGNOSIS:  1. Small hiatal hernia.  2. Otherwise within normal limits esophagogastroduodenoscopy status post     duodenal biopsy to rule out malabsorption.   PLAN:  Return care to Dr. Chari Manning, happy to see back p.r.n.  Would probably  repeat guaiacs one time just  to make sure no further GI workup is needed.  Like a small bowel followthrough just to be sure.  Her iron deficiency is  probably due to her periods and would leave it to Dr. Chari Manning or gynecology  hysterectomy or hormonal manipulation to help that and leave it to her  putting her on iron and followup CBC's.                                               Petra Kuba, M.D.    MEM/MEDQ  D:  12/22/2003  T:  12/22/2003  Job:  [id number]   cc:   Harvin Hazel, M.D.

## 2011-02-10 NOTE — Op Note (Signed)
NAME:  [name], [name]                        ACCOUNT NO.:  [id number]   MEDICAL RECORD NO.:  [id number]                   PATIENT TYPE:  AMB   LOCATION:  ENDO                                 FACILITY:  [facility]   PHYSICIAN:  Petra Kuba, M.D.                 DATE OF BIRTH:  1961-07-23   DATE OF PROCEDURE:  12/22/2003  DATE OF DISCHARGE:                                 OPERATIVE REPORT   PROCEDURE:  Colonoscopy.   INDICATIONS:  Microcytic anemia.  Family history of colon cancer.  Consent  was signed after risks, benefits, methods, and options were thoroughly  discussed in the office.   PREMEDICATIONS:  Demerol 100 mg, Versed 0 mg.   DESCRIPTION OF PROCEDURE:  Rectal inspection pertinent for external  hemorrhoids, small.  Digital exam was negative.  Video colonoscope was  inserted and with some difficulty due to a long, looping, tortuous colon,  was able to be advanced to the cecum.  This did require rolling her on her  back and then rolling her on her right side to advance to the cecal pole,  rolling her all the way back to her left side.  Some abdominal pressures  were tried.  No obvious abnormality was seen on insertion.  The cecum is  identified by the appendiceal orifice and the ileocecal valve.  Prep was  fairly adequate.  It was better on the left than the right.  There was some  stool adherent to the wall on the right side which could not be washed off,  but on slow withdrawal through the colon, adequate visualization was  obtained overall.  The cecum, ascending, transverse, and descending were all  normal.  In the distal sigmoid and rectum, three tiny polyps were seen and  were all hot biopsied and put in the same container.  Anorectal pull-through  and retroflexion confirmed some small hemorrhoids.  The scope was reinserted  a short ways up the left side of the colon.  Air was suctioned.  The scope  was removed.  The patient tolerated the procedure well.  There was no  obvious immediate complication.   ENDOSCOPIC DIAGNOSES:  1. Internal and external small hemorrhoids.  2. Three tiny rectal distal sigmoid polyps, hot biopsied.  3. Otherwise within normal limits to the cecum.   PLAN:  Await pathology.  Probably recheck colon screening anywhere between  [age] years and age 68.  Continue workup with a one-time EGD just to be sure.                                               Petra Kuba, M.D.    MEM/MEDQ  D:  12/22/2003  T:  12/22/2003  Job:  [id number]   cc:   Pam Drown, M.D.  [address]  [place]  [place] [address]  Fax: [phone]

## 2011-02-10 NOTE — Op Note (Signed)
[name], [name]               ACCOUNT NO.:  [id number]   MEDICAL RECORD NO.:  [id number]          PATIENT TYPE:  INP   LOCATION:  5708                         FACILITY:  [facility]   PHYSICIAN:  Angelia Mould. Derrell Lolling, M.D.DATE OF BIRTH:  02-22-1961   DATE OF PROCEDURE:  12/04/2006  DATE OF DISCHARGE:  12/06/2006                               OPERATIVE REPORT   PREOPERATIVE DIAGNOSIS:  Invasive cancer, left breast, with the BRCA-1  gene positive.   POSTOPERATIVE DIAGNOSIS:  Invasive cancer, left breast, with the BRCA-1  gene positive.   OPERATION PERFORMED:  1. Injection of blue dye, left breast.  2. Left axillary sentinel lymph node biopsy.  3. Left modified radical mastectomy.  4. Right total mastectomy.  5. Insertion of PowerPort, right internal jugular vein with      fluoroscopic guidance.   SURGEON:  Angelia Mould. Derrell Lolling, M.D.   FIRST ASSISTANT:  Ardeth Sportsman, M.D.   OPERATIVE INDICATIONS:  This is a 50 year old black female who felt a  mass in the 12 o'clock position of the left breast recently.  Mammograms  and ultrasound showed a 3.0-cm mass in the 11-12 o'clock position high  in the left breast.  Image-guided biopsy showed invasive breast cancer  which ultimately turned out to be ER/PR negative, HER2 2%.  She has a  very, very strong family history for breast cancer.  She underwent  genetic counseling.  Her genetic testing revealed that she is BRCA-1  gene positive.  We have had several conversations about her disease.  She has had MRI which shows no evidence of adenopathy, but a PET CT  suggests a positive lymph node in the left axilla.  Ultimately, she  decided that she wanted a bilateral mastectomy.  Dr. Pierce Crane asked  me to put a port at the same time if possible, that she would need  chemotherapy regardless.  She has been counseled about all this as an  outpatient and is brought to operating room electively.  We have  discussed reconstruction and have decided  that will be considered as a  delayed option.   OPERATIVE TECHNIQUE:  Following the induction of general endotracheal  anesthesia, the patient's arms were placed out to the sides at 90  degrees.  The neck, chest, chest wall, axilla, and posterolateral chest  were prepped and draped in a sterile fashion.   I injected 5 mL of blue dye in the left breast subareolar area.  This  consisted of 2 mL of methylene blue and 3 mL of saline.  This was  injected and massaged for about 3-5 minutes.   The Neoprobe was used, and I could identify the area of markedly  increased activity in the left axilla.  I palpated the tumor in the  superior aspect of the left breast which was quite high, and so I marked  a transverse incision which had to extend fairly high up and around the  tumor and inferiorly had to involve and remove the nipple-areolar  complex completely.  This transverse superiorly-based elliptical  incision was made.  Dissection was carried down into  the subcutaneous  tissue.  We raised skin flaps superiorly and laterally until we could  identify two sentinel lymph nodes.  The first sentinel lymph node was  almost in the breast and was quite blue and quite hot and was sent to  pathology.  We then found another more enlarged lymph node more  traditionally placed in the level 1 position and excised this as well.  This was larger and more worrisome.  Dr. Jimmy Picket performed imprint  cytology.  He stated that the first lymph node showed no evidence of  cancer, but the second one showed very atypical cells consistent with  cancer.  For this reason, we felt she needed a modified radical  mastectomy with axillary node dissection.   I then continued and raised skin flaps superiorly to just below the  clavicle, medially to almost in the midline, inferiorly to the upper  border of the anterior rectus sheath, and laterally to the latissimus  dorsi muscle.  The breast and underlying pectoralis  fascia was then  dissected away from the pectoralis major muscle, leaving the muscle  intact.  I dissected the breast tissue off of the lateral border of  pectoralis major and pectoralis minor muscles.  The axilla was entered.  Dissection was carried superiorly until the axillary vein was  identified.  A couple of venous tributaries were isolated, secured with  multiple metal clips, and divided.  With sharp and blunt dissection, I  dissected all of the axillary contents from superiorly to inferiorly.  I  identified the thoracodorsal neurovascular bundle and the long thoracic  nerve.  Both of these structures were preserved and were functioning at  the completion of the case.  The specimen and axillary contents were  then removed and sent to the lab as a separate specimen.  Hemostasis was  excellent and achieved with electrocautery.  Two 19-French Blake drains  were placed in the wounds, one across the skin flaps anteriorly and one  up into the axilla.  These were brought out through separate stab  incisions inferolaterally and connected to suction bulbs and sutured to  the skin with nylon sutures.  The skin was closed with skin staples.   We then changed our gloves and some of our instruments and light handles  and Bovie tip and suction tip.   On the right side, I marked a more traditional elliptical incision which  was transversely but was a bit lower than the left side.  The incision  was made.  Dissection was carried down into the subcutaneous tissue.  Skin flaps were raised superiorly, medially, inferiorly, and laterally  to the same borders as on the left.  The breast was then dissected away  from the pectoralis major muscle with electrocautery.  We dissected the  breast away from the lateral border of the pectoralis major and  pectoralis minor muscles.  We identified the tail of Spence and found  one or two lymph nodes there and removed them with the specimen.  We marked these  level 1 lymph nodes with a silk suture and sent the right  breast and level 1 lymph nodes as a separate specimen.  On the right  side,  hemostasis was excellent and achieved with electrocautery.  The  incisions and the wound was irrigated with saline.  I placed two 19-  [place] Blake drains on the right side, one up into the axillary space  and one across the skin flaps.  These were brought out  through separate  stab incisions laterally, sutured to the skin with nylon sutures, and  connected to suction bulbs.  The skin incisions were closed with skin  staples.   I then placed very conservative bandages over the drain sites and  wounds.  We then removed all the drapes in a sterile fashion and placed  her arms at her sides.  We placed a roll behind her shoulders and turned  her neck to the left.  We then prepped the neck and chest with Betadine  and draped the wound out.   A right internal jugular venipuncture was performed without difficulty  and a guidewire inserted into the central venous circulation with  fluoroscopic guidance.  A small transverse incision was made at this  site.  A transverse incision was made about 3 cm below the right  clavicle.  Dissection was carried down through the subcutaneous tissue  until I got close to the pectoralis fascia.  A PowerPort single-lumen  device was brought to the operative field.  The catheter was drawn  between the neck incision and the infraclavicular incision with a  tunneling device.  Using fluoroscopic guidance, I measured the catheter  so that the tip would be just above the right atrium and the distal  superior vena cava.  The catheter was then cut and secured to the port  with a locking device.  The port and catheter were flushed with  heparinized saline.  The port was sutured to the pectoralis fascia with  two interrupted sutures of 2-0 Prolene.  With the patient back in  Trendelenburg position, I inserted the dilator and peel-away  sheath  assembly over the guidewire into the central venous circulation, removed  the guidewire and dilator.  I then inserted the catheter into the  central venous circulation and removed the peel-away sheath.  I had  excellent blood return and excellent flow in the catheter.  Fluoroscopy  confirmed that the tip of the catheter was in the superior vena cava  just at or slightly into the right atrium.   I feel that the catheter tip is in a safe location and can be used for  chemotherapy at any time with low risk.   I flushed the port and the catheter with concentrated heparin solution,  100 units/cc.  The subcutaneous tissue was closed with interrupted  sutures of 3-0 Vicryl and the skin incisions closed with running  subcuticular sutures of 4-0 Monocryl and Steri-Strips.  Clean bandages  were placed on the port site.   We then redressed both mastectomy wounds with Adaptic, bulky bandages,  and 6-inch Ace wraps wrapped around the patient's chest.  There was minimal bleeding from the drains during this period of time.   The patient tolerated the procedure well and was taken to the recovery  room in stable condition.  The were no arrhythmias within the operating  room.   ESTIMATED BLOOD LOSS:  About 400 mL.   COMPLICATIONS:  None.   COUNTS:  Sponge, needle, and instrument counts were correct.      Angelia Mould. Derrell Lolling, M.D.  Electronically Signed     HMI/MEDQ  D:  12/04/2006  T:  12/06/2006  Job:  [id number]   cc:   Pam Drown, M.D.  Pierce Crane, M.D.

## 2011-02-10 NOTE — H&P (Signed)
NAMEOLETHA, [name]               ACCOUNT NO.:  [id number]   MEDICAL RECORD NO.:  [id number]          PATIENT TYPE:  INP   LOCATION:                               FACILITY:  [facility]   PHYSICIAN:  Madlyn Frankel. Charlann Boxer, M.D.  DATE OF BIRTH:  [birth year]   DATE OF ADMISSION:  12/28/2008  DATE OF DISCHARGE:                              HISTORY & PHYSICAL   PROCEDURE:  Will be a left total knee replacement.   ATTENDING PHYSICIAN:  Dr. Durene Romans.   CHIEF COMPLAINT:  Left knee pain.   HISTORY OF PRESENT ILLNESS:  A 50 year old female with a history of left  knee pain secondary to osteoarthritis.  It has been refractory to all  conservative treatment.  Presurgically assessed prior to surgery by  primary care physician, Dr. Ovidio Kin.   PRIMARY CARE PHYSICIAN:  Dr. Ovidio Kin.   OTHER PHYSICIANS:  Dr. Pierce Crane and Dr. Claud Kelp.   PAST MEDICAL HISTORY:  1. Significant for osteoarthritis.  2. Breast cancer.  3. Hypertension.   PAST SURGERIES:  C-section, double mastectomy, hysterectomy, lap band  surgery, ear surgery.   FAMILY HISTORY:  Cancer.   SOCIAL HISTORY:  Nonsmoker.  He is planning a rehab stay  postoperatively.   DRUG ALLERGIES:  No known drug allergies.   MEDICATIONS:  1. Benicar 20 mg p.o. daily.  2. Hydrochlorothiazide 25 mg p.o. daily.  3. Vitamin B12 500 mcg daily.  4. Caltrate plus vitamin D p.o. b.i.d.  5. Multivitamin daily.  6. Indocin 50 mg suppository daily.  7. Vegetable laxative daily.  8. Celebrex 200 mg 1 p.o. b.i.d.   REVIEW OF SYSTEMS:  GENERAL:  She has night sweats.  CARDIOVASCULAR:  Last EKG was done in February 2010.  MUSCULOSKELETAL:  She has joint  pain, joint swelling, back pain, spasms, morning stiffness.  Otherwise  see HPI.   PHYSICAL EXAMINATION:  VITAL SIGNS:  Pulse 72, respirations 16, blood  pressure 140/84.  GENERAL:  Awake, alert and oriented, well developed, well nourished.  HEENT: Normocephalic.  NECK:  Supple.  No  carotid bruits.  CHEST/LUNGS:  Clear to auscultation bilaterally.  BREASTS:  Deferred.  HEART:  S1, S2 distinct.  ABDOMEN:  Soft, nontender, bowel sounds present.  PELVIS:  Stable.  GENITOURINARY:  Deferred.  EXTREMITIES:  Left knee anterior pain.  SKIN:  No cellulitis.  NEUROLOGIC:  Intact distal sensibilities.   LABORATORY DATA:  Labs, EKG, chest x-ray pending presurgical testing.   IMPRESSION:  Left knee osteoarthritis.   PLAN OF ACTION:  Left total knee replacement by Dr. Charlann Boxer at [facility] on December 28, 2008.  Risks and complication were discussed.   No postoperative medicines provided.  The patient is planning a rehab  stay postoperatively.     ______________________________  [name], [place]      Madlyn Frankel. Charlann Boxer, M.D.  Electronically Signed    BLM/MEDQ  D:  12/07/2008  T:  12/07/2008  Job:  [id number]   cc:   Sandria Bales. Ezzard Standing, M.D.  [address], [address]  [place]  [place] [address]  Pierce Crane, MD  Fax: [phone]   Angelia Mould. Derrell Lolling, M.D.  [address], [address]  [place]  [place] [address]

## 2011-05-02 ENCOUNTER — Encounter (HOSPITAL_BASED_OUTPATIENT_CLINIC_OR_DEPARTMENT_OTHER): Payer: Medicare Other | Admitting: Oncology

## 2011-05-02 ENCOUNTER — Other Ambulatory Visit: Payer: Self-pay | Admitting: Oncology

## 2011-05-02 DIAGNOSIS — C50219 Malignant neoplasm of upper-inner quadrant of unspecified female breast: Secondary | ICD-10-CM

## 2011-05-02 LAB — CBC WITH DIFFERENTIAL/PLATELET
Basophils Absolute: 0 10*3/uL (ref 0.0–0.1)
Eosinophils Absolute: 0.1 10*3/uL (ref 0.0–0.5)
HCT: 33.7 % — ABNORMAL LOW (ref 34.8–46.6)
HGB: 11.3 g/dL — ABNORMAL LOW (ref 11.6–15.9)
MCH: 28.5 pg (ref 25.1–34.0)
MCV: 84.8 fL (ref 79.5–101.0)
MONO%: 5.9 % (ref 0.0–14.0)
NEUT#: 4.4 10*3/uL (ref 1.5–6.5)
NEUT%: 56.9 % (ref 38.4–76.8)
RDW: 14.9 % — ABNORMAL HIGH (ref 11.2–14.5)

## 2011-05-03 LAB — CANCER ANTIGEN 27.29: CA 27.29: 19 U/mL (ref 0–39)

## 2011-05-03 LAB — COMPREHENSIVE METABOLIC PANEL
Albumin: 4 g/dL (ref 3.5–5.2)
Alkaline Phosphatase: 86 U/L (ref 39–117)
BUN: 20 mg/dL (ref 6–23)
Calcium: 9.8 mg/dL (ref 8.4–10.5)
Chloride: 100 mEq/L (ref 96–112)
Creatinine, Ser: 0.74 mg/dL (ref 0.50–1.10)
Glucose, Bld: 92 mg/dL (ref 70–99)
Potassium: 3.6 mEq/L (ref 3.5–5.3)

## 2011-05-03 LAB — VITAMIN D 25 HYDROXY (VIT D DEFICIENCY, FRACTURES): Vit D, 25-Hydroxy: 38 ng/mL (ref 30–89)

## 2011-05-09 ENCOUNTER — Encounter: Payer: Medicare Other | Admitting: Oncology

## 2011-06-27 LAB — CBC
HCT: 36.6
MCV: 86.3
MCV: 87.1
Platelets: 193
Platelets: 227
RDW: 15.7 — ABNORMAL HIGH
WBC: 6.6
WBC: 8

## 2011-06-27 LAB — DIFFERENTIAL
Basophils Relative: 0
Basophils Relative: 0
Eosinophils Absolute: 0
Eosinophils Absolute: 0.1
Eosinophils Relative: 1
Lymphs Abs: 2.2
Lymphs Abs: 2.4
Neutrophils Relative %: 66

## 2011-06-27 LAB — BASIC METABOLIC PANEL
BUN: 16
Chloride: 102
Creatinine, Ser: 0.59
Glucose, Bld: 93

## 2011-06-28 LAB — BASIC METABOLIC PANEL
CO2: 30
Calcium: 10.1
GFR calc Af Amer: >60
GFR calc non Af Amer: >60
Sodium: 140

## 2011-06-30 LAB — CBC
Hemoglobin: 9.4 — ABNORMAL LOW
RBC: 3.33 — ABNORMAL LOW
WBC: 8.7

## 2011-06-30 LAB — DIFFERENTIAL
Basophils Relative: 0
Eosinophils Relative: 2
Lymphocytes Relative: 24
Neutrophils Relative %: 66

## 2011-06-30 LAB — BASIC METABOLIC PANEL
Calcium: 9.3
GFR calc Af Amer: >60
GFR calc non Af Amer: >60
Sodium: 136

## 2011-06-30 LAB — POCT CARDIAC MARKERS
Myoglobin, poc: 52.8
Operator id: 270651
Troponin i, poc: <0.05

## 2011-07-12 LAB — CBC
HCT: 31.2 — ABNORMAL LOW
MCHC: 33
MCV: 80.9
RBC: 3.85 — ABNORMAL LOW

## 2011-07-12 LAB — BASIC METABOLIC PANEL
CO2: 31
Chloride: 100
Creatinine, Ser: 0.67
GFR calc Af Amer: >60
Potassium: 3.8

## 2011-07-12 LAB — TYPE AND SCREEN: ABO/RH(D): AB POS

## 2011-07-13 LAB — DIFFERENTIAL
Eosinophils Absolute: 0.1
Lymphs Abs: 1.9
Monocytes Relative: 7
Neutro Abs: 4.3
Neutrophils Relative %: 63

## 2011-07-13 LAB — URINALYSIS, ROUTINE W REFLEX MICROSCOPIC
Bilirubin Urine: NEGATIVE
Glucose, UA: NEGATIVE
Hgb urine dipstick: NEGATIVE
Ketones, ur: NEGATIVE
Nitrite: NEGATIVE
pH: 6.5

## 2011-07-13 LAB — COMPREHENSIVE METABOLIC PANEL
BUN: 9
CO2: 30
Calcium: 9.5
Creatinine, Ser: 0.57
GFR calc non Af Amer: >60
Glucose, Bld: 138 — ABNORMAL HIGH
Total Protein: 8.1

## 2011-07-13 LAB — CBC
HCT: 33.6 — ABNORMAL LOW
Hemoglobin: 10.7 — ABNORMAL LOW
MCHC: 31.9
MCV: 82.5
RBC: 4.08
RDW: 15.8 — ABNORMAL HIGH

## 2011-07-13 LAB — PROTIME-INR
INR: 1
Prothrombin Time: 13

## 2011-07-13 LAB — APTT: aPTT: 32

## 2011-07-13 LAB — PREGNANCY, URINE: Preg Test, Ur: NEGATIVE

## 2011-09-02 ENCOUNTER — Telehealth: Payer: Self-pay | Admitting: Oncology

## 2011-09-02 NOTE — Telephone Encounter (Signed)
per pof 08/14 called pts home to schedule appt s for [id number] no answer.  will mail appts to pt on 09/02/2011

## 2011-09-23 ENCOUNTER — Encounter: Payer: Self-pay | Admitting: Adult Health

## 2011-09-23 ENCOUNTER — Emergency Department (HOSPITAL_COMMUNITY)
Admission: EM | Admit: 2011-09-23 | Discharge: 2011-09-24 | Disposition: A | Discharge status: Home / Self Care | Payer: Medicare Other | Attending: Emergency Medicine | Admitting: Emergency Medicine

## 2011-09-23 ENCOUNTER — Emergency Department (HOSPITAL_COMMUNITY): Payer: Medicare Other

## 2011-09-23 ENCOUNTER — Other Ambulatory Visit: Payer: Self-pay

## 2011-09-23 DIAGNOSIS — Z79899 Other long term (current) drug therapy: Secondary | ICD-10-CM | POA: Insufficient documentation

## 2011-09-23 DIAGNOSIS — R1031 Right lower quadrant pain: Secondary | ICD-10-CM | POA: Insufficient documentation

## 2011-09-23 DIAGNOSIS — Z9884 Bariatric surgery status: Secondary | ICD-10-CM | POA: Insufficient documentation

## 2011-09-23 DIAGNOSIS — I10 Essential (primary) hypertension: Secondary | ICD-10-CM | POA: Insufficient documentation

## 2011-09-23 DIAGNOSIS — R10819 Abdominal tenderness, unspecified site: Secondary | ICD-10-CM | POA: Insufficient documentation

## 2011-09-23 HISTORY — DX: Malignant neoplasm of unspecified site of unspecified female breast: C50.919

## 2011-09-23 HISTORY — DX: Essential (primary) hypertension: I10

## 2011-09-23 HISTORY — DX: Bariatric surgery status: Z98.84

## 2011-09-23 LAB — CBC
HCT: 32.8 % — ABNORMAL LOW (ref 36.0–46.0)
Hemoglobin: 10.4 g/dL — ABNORMAL LOW (ref 12.0–15.0)
MCHC: 31.7 g/dL (ref 30.0–36.0)
MCV: 84.3 fL (ref 78.0–100.0)
RDW: 14.9 % (ref 11.5–15.5)

## 2011-09-23 LAB — DIFFERENTIAL
Basophils Absolute: 0 10*3/uL (ref 0.0–0.1)
Basophils Relative: 0 % (ref 0–1)
Eosinophils Relative: 2 % (ref 0–5)
Monocytes Absolute: 0.4 10*3/uL (ref 0.1–1.0)
Monocytes Relative: 5 % (ref 3–12)
Neutro Abs: 4.3 10*3/uL (ref 1.7–7.7)

## 2011-09-23 LAB — COMPREHENSIVE METABOLIC PANEL
AST: 17 U/L (ref 0–37)
Albumin: 3.7 g/dL (ref 3.5–5.2)
BUN: 10 mg/dL (ref 6–23)
CO2: 28 mEq/L (ref 19–32)
Calcium: 9.7 mg/dL (ref 8.4–10.5)
Chloride: 99 mEq/L (ref 96–112)
Creatinine, Ser: 0.57 mg/dL (ref 0.50–1.10)
GFR calc non Af Amer: >90 mL/min (ref >90)
Total Bilirubin: 0.2 mg/dL — ABNORMAL LOW (ref 0.3–1.2)

## 2011-09-23 MED ORDER — POTASSIUM CHLORIDE CRYS ER 20 MEQ PO TBCR
20.0000 meq | EXTENDED_RELEASE_TABLET | Freq: Two times a day (BID) | ORAL | Status: DC
  Administered 2011-09-23: 20 meq via ORAL
  Filled 2011-09-23: qty 1

## 2011-09-23 MED ORDER — SODIUM CHLORIDE 0.9 % IV SOLN
Freq: Once | INTRAVENOUS | Status: AC
  Administered 2011-09-23: 22:00:00 via INTRAVENOUS

## 2011-09-23 MED ORDER — IOHEXOL 300 MG/ML  SOLN
100.0000 mL | Freq: Once | INTRAMUSCULAR | Status: AC | PRN
  Administered 2011-09-23: 100 mL via INTRAVENOUS

## 2011-09-23 NOTE — ED Notes (Signed)
Patient has finished drinking contrast for CT. CT notified.

## 2011-09-23 NOTE — ED Notes (Signed)
C/o RUQ abdominal pain that is intermittent and described as twisting pain that is worse while lying on right side, denies SOB, c/o nausea. Having regualr BM lst one today.

## 2011-09-23 NOTE — ED Notes (Signed)
Patient transported to CT 

## 2011-09-23 NOTE — ED Notes (Signed)
CT tech at bedside to administer oral contrast.

## 2011-09-23 NOTE — ED Notes (Signed)
[id number]<AV> Expected date:<BR> Expected time:<BR> Means of arrival:<BR> Comments:<BR> [place] in [place].

## 2011-09-23 NOTE — ED Provider Notes (Signed)
History     CSN: [id number]  Arrival date & time 09/23/11  1656   First MD Initiated Contact with Patient 09/23/11 2034      Chief Complaint  Patient presents with  . Abdominal Pain    (Consider location/radiation/quality/duration/timing/severity/associated sxs/prior treatment) HPI Comments: Described is a morbidly obese, African American female, status post lap band surgery from 2009.  Now with 2 days of right upper and lower quadrant pain, worse when she lies on that side.  Pain cannot be reproduced with deep breathing, but is reproducible with palpation.  Denies fever, nausea, vomiting, constipation, diarrhea, dysuria.  Patient is a 50 y.o. female presenting with abdominal pain. The history is provided by the patient.  Abdominal Pain The primary symptoms of the illness include abdominal pain. The primary symptoms of the illness do not include fever, shortness of breath, nausea, vomiting, diarrhea or dysuria. The current episode started yesterday. The onset of the illness was gradual. The problem has not changed since onset. The patient states that she believes she is currently not pregnant. The patient has not had a change in bowel habit. Risk factors for an acute abdominal problem include a history of abdominal surgery. Symptoms associated with the illness do not include chills, diaphoresis, constipation, urgency, hematuria, frequency or back pain.    Past Medical History  Diagnosis Date  . LAP-BAND surgery status   . Breast cancer   . Hypertension     Past Surgical History  Procedure Date  . Total knee arthroplasty   . Abdominal hysterectomy   . Mastectomy     History reviewed. No pertinent family history.  History  Substance Use Topics  . Smoking status: Never Smoker   . Smokeless tobacco: Not on file  . Alcohol Use: No    OB History    Grav Para Term Preterm Abortions TAB SAB Ect Mult Living                  Review of Systems  Constitutional: Negative for  fever, chills and diaphoresis.  HENT: Negative for rhinorrhea.   Respiratory: Negative for shortness of breath.   Cardiovascular: Negative for chest pain and leg swelling.  Gastrointestinal: Positive for abdominal pain. Negative for nausea, vomiting, diarrhea and constipation.  Genitourinary: Negative for dysuria, urgency, frequency and hematuria.  Musculoskeletal: Negative for back pain.  Skin: Negative.   Neurological: Negative for dizziness and weakness.    Allergies  Review of patient's allergies indicates no known allergies.  Home Medications   Current Outpatient Rx  Name Route Sig Dispense Refill  . CALCIUM 600 + D PO Oral Take 1 tablet by mouth daily.      Marland Kitchen CETIRIZINE HCL 10 MG PO TABS Oral Take 10 mg by mouth daily.      Marland Kitchen ESOMEPRAZOLE MAGNESIUM 40 MG PO CPDR Oral Take 40 mg by mouth daily before breakfast.      . FERROUS SULFATE 325 (65 FE) MG PO TABS Oral Take 325 mg by mouth daily with breakfast.      . HYDROCHLOROTHIAZIDE 25 MG PO TABS Oral Take 25 mg by mouth daily.      Marland Kitchen LOSARTAN POTASSIUM 100 MG PO TABS Oral Take 100 mg by mouth daily.      . ONE-A-DAY WOMENS 50 PLUS PO Oral Take 1 tablet by mouth daily.      Marland Kitchen NAPROXEN SODIUM 220 MG PO TABS Oral Take 660 mg by mouth daily as needed. pain  BP 151/76  Pulse 64  Temp 98.7 F (37.1 C)  Resp 16  SpO2 98%  Physical Exam  Constitutional: She is oriented to person, place, and time. She appears well-developed and well-nourished.  HENT:  Head: Normocephalic.  Eyes: Pupils are equal, round, and reactive to light.  Neck: Normal range of motion.  Cardiovascular: Normal rate.   Pulmonary/Chest: Effort normal and breath sounds normal. She exhibits no tenderness.  Abdominal: Soft. Bowel sounds are normal. She exhibits no distension. There is no hepatosplenomegaly. There is tenderness. There is no rebound and no guarding.    Musculoskeletal: Normal range of motion.  Neurological: She is oriented to person, place,  and time.  Skin: Skin is warm and dry. No pallor.  Psychiatric: She has a normal mood and affect.    ED Course  Procedures (including critical care time)  Labs Reviewed  CBC - Abnormal; Notable for the following:    Hemoglobin 10.4 (*)    HCT 32.8 (*)    All other components within normal limits  COMPREHENSIVE METABOLIC PANEL - Abnormal; Notable for the following:    Potassium 3.4 (*)    Glucose, Bld 102 (*)    Total Bilirubin 0.2 (*)    All other components within normal limits  DIFFERENTIAL   Ct Abdomen Pelvis W Contrast  09/23/2011  *RADIOLOGY REPORT*  Clinical Data: Right lower quadrant pain.  Ileus on x-ray.  CT ABDOMEN AND PELVIS WITH CONTRAST  Technique:  Multidetector CT imaging of the abdomen and pelvis was performed following the standard protocol during bolus administration of intravenous contrast.  Contrast: OMNIPAQUE IOHEXOL 300 MG/ML IV SOLN  Comparison: None.  Findings: Postoperative changes with gastric lap band in appropriate orientation.  Small gastric pouch.  Contrast material flows through the stomach and small bowel without distension or wall thickening.  No evidence of obstruction.  Minimal dependent changes in the lung bases.  The liver, spleen, gallbladder, pancreas, adrenal glands, kidneys, abdominal aorta, and retroperitoneal lymph nodes are unremarkable.  Stool filled colon without evidence of wall thickening or distension.  No free air or free fluid in the abdomen.  Pelvis:  The bladder wall is not thickened.  The uterus is surgically absent.  No abnormal adnexal masses.  No abnormal pelvic lymphadenopathy.  The appendix is normal.  No inflammatory changes in the sigmoid colon.  No free or loculated pelvic fluid collections.  Degenerative changes in the lumbar spine.  IMPRESSION: Postoperative changes with gastric lap band in place.  Normal orientation is suggested.  No evidence of bowel obstruction.  Original Report Authenticated By: Marlon Pel, M.D.     Dg Abd Acute W/chest  09/23/2011  *RADIOLOGY REPORT*  Clinical Data: Right abdominal pain, rule out obstruction or free air  ACUTE ABDOMEN SERIES (ABDOMEN 2 VIEW & CHEST 1 VIEW)  Comparison: None  Findings: Cardiomediastinal silhouette is unremarkable.  No acute infiltrate or pleural effusion.  A gastric banding device is noted proximal gastric region.  Mild distended small bowel loops in mid abdomen with air-fluid levels suspicious for ileus or early bowel obstruction.  No free abdominal air is noted.  IMPRESSION:  1.  No acute disease within chest.  Mild distended small bowel loops mid abdomen with some air fluid levels suspicious for ileus or early bowel obstruction.  No free abdominal air.  Original Report Authenticated By: Natasha Mead, M.D.     1. Abdominal pain     ED ECG REPORT   Date: 09/23/2011  EKG Time:  11:49 PM  Rate: 63  Rhythm: normal sinus rhythm,  normal EKG, normal sinus rhythm, unchanged from previous tracings, there are no previous tracings available for comparison  Axis: normal  Intervals:none  ST&T Change: normal  Narrative Interpretation: normal            MDM  Will evaluate for GB disease, obstruction, appendicitis, kidney stone with labs urine and acute abdomen        Arman Filter, NP 09/23/11 2053  Arman Filter, NP 09/23/11 2141  Arman Filter, NP 09/23/11 2345  Arman Filter, NP 09/23/11 2349

## 2011-09-23 NOTE — ED Notes (Signed)
Returned from CT.

## 2011-09-25 NOTE — ED Provider Notes (Signed)
History/physical exam/procedure(s) were performed by non-physician practitioner and as supervising physician I was immediately available for consultation/collaboration. I have reviewed all notes and am in agreement with care and plan.   Hilario Quarry, MD 09/25/11 [phone]

## 2011-09-26 DIAGNOSIS — M069 Rheumatoid arthritis, unspecified: Secondary | ICD-10-CM

## 2011-09-26 HISTORY — DX: Rheumatoid arthritis, unspecified: M06.9

## 2011-11-09 ENCOUNTER — Ambulatory Visit: Payer: Medicare Other | Admitting: Oncology

## 2011-11-09 ENCOUNTER — Other Ambulatory Visit: Payer: Medicare Other | Admitting: Lab

## 2011-11-12 ENCOUNTER — Emergency Department (HOSPITAL_COMMUNITY): Payer: Medicare Other

## 2011-11-12 ENCOUNTER — Emergency Department (HOSPITAL_COMMUNITY)
Admission: EM | Admit: 2011-11-12 | Discharge: 2011-11-12 | Disposition: A | Discharge status: Home / Self Care | Payer: Medicare Other | Attending: Emergency Medicine | Admitting: Emergency Medicine

## 2011-11-12 ENCOUNTER — Encounter (HOSPITAL_COMMUNITY): Payer: Self-pay

## 2011-11-12 DIAGNOSIS — M25572 Pain in left ankle and joints of left foot: Secondary | ICD-10-CM

## 2011-11-12 DIAGNOSIS — Z79899 Other long term (current) drug therapy: Secondary | ICD-10-CM | POA: Insufficient documentation

## 2011-11-12 DIAGNOSIS — IMO0001 Reserved for inherently not codable concepts without codable children: Secondary | ICD-10-CM | POA: Insufficient documentation

## 2011-11-12 DIAGNOSIS — M2540 Effusion, unspecified joint: Secondary | ICD-10-CM | POA: Insufficient documentation

## 2011-11-12 DIAGNOSIS — M25579 Pain in unspecified ankle and joints of unspecified foot: Secondary | ICD-10-CM | POA: Insufficient documentation

## 2011-11-12 DIAGNOSIS — Z853 Personal history of malignant neoplasm of breast: Secondary | ICD-10-CM | POA: Insufficient documentation

## 2011-11-12 DIAGNOSIS — M7989 Other specified soft tissue disorders: Secondary | ICD-10-CM | POA: Insufficient documentation

## 2011-11-12 DIAGNOSIS — I10 Essential (primary) hypertension: Secondary | ICD-10-CM | POA: Insufficient documentation

## 2011-11-12 DIAGNOSIS — M79609 Pain in unspecified limb: Secondary | ICD-10-CM | POA: Insufficient documentation

## 2011-11-12 MED ORDER — TRAMADOL HCL 50 MG PO TABS: 50.0000 mg | ORAL_TABLET | Freq: Four times a day (QID) | ORAL | Status: AC | PRN

## 2011-11-12 MED ORDER — KETOROLAC TROMETHAMINE 30 MG/ML IJ SOLN
30.0000 mg | Freq: Once | INTRAMUSCULAR | Status: AC
  Administered 2011-11-12: 30 mg via INTRAMUSCULAR
  Filled 2011-11-12 (×2): qty 1

## 2011-11-12 NOTE — ED Notes (Signed)
Attempted to obtain a blood pressure, pt states it's to tight and rips cuff off of her arm. Pt states " I can't handle that!" Will notify RN and have her attempt.

## 2011-11-12 NOTE — Discharge Instructions (Signed)
Your x-ray showed some slight osteoarthritis to your ankle joint. You may have had a flare of this arthritis which is causing your swelling and pain. You've been placed in a ankle splint. Please use the splint and walker for the next several days. Use ice to the foot. Please call the orthopedist tomorrow to make an appointment to get your foot rechecked. If you're having worsening pain or swelling, numbness, weakness, tingling, or any other worrisome symptoms, please return to the ER.  Ankle Pain Ankle pain is a common symptom. The bones, cartilage, tendons, and muscles of the ankle joint perform a lot of work each day. The ankle joint holds your body weight and allows you to move around. Ankle pain can occur on either side or back of 1 or both ankles. Ankle pain may be sharp and burning or dull and aching. There may be tenderness, stiffness, redness, or warmth around the ankle. The pain occurs more often when a person walks or puts pressure on the ankle. CAUSES  There are many reasons ankle pain can develop. It is important to work with your caregiver to identify the cause since many conditions can impact the bones, cartilage, muscles, and tendons. Causes for ankle pain include:  Injury, including a break (fracture), sprain, or strain often due to a fall, sports, or a high-impact activity.   Swelling (inflammation) of a tendon (tendonitis).   Achilles tendon rupture.   Ankle instability after repeated sprains and strains.   Poor foot alignment.   Pressure on a nerve (tarsal tunnel syndrome).   Arthritis in the ankle or the lining of the ankle.   Crystal formation in the ankle (gout or pseudogout).  DIAGNOSIS  A diagnosis is based on your medical history, your symptoms, results of your physical exam, and results of diagnostic tests. Diagnostic tests may include X-ray exams or a computerized magnetic scan (magnetic resonance imaging, MRI). TREATMENT  Treatment will depend on the cause of your  ankle pain and may include:  Keeping pressure off the ankle and limiting activities.   Using crutches or other walking support (a cane or brace).   Using rest, ice, compression, and elevation.   Participating in physical therapy or home exercises.   Wearing shoe inserts or special shoes.   Losing weight.   Taking medications to reduce pain or swelling or receiving an injection.   Undergoing surgery.  HOME CARE INSTRUCTIONS   Only take over-the-counter or prescription medicines for pain, discomfort, or fever as directed by your caregiver.   Put ice on the injured area.   Put ice in a plastic bag.   Place a towel between your skin and the bag.   Leave the ice on for 15 to 20 minutes at a time, 3 to 4 times a day.   Keep your leg raised (elevated) when possible to lessen swelling.   Avoid activities that cause ankle pain.   Follow specific exercises as directed by your caregiver.   Record how often you have ankle pain, the location of the pain, and what it feels like. This information may be helpful to you and your caregiver.   Ask your caregiver about returning to work or sports and whether you should drive.   Follow up with your caregiver for further examination, therapy, or testing as directed.  SEEK MEDICAL CARE IF:   Pain or swelling continues or worsens beyond 1 week.   You have an oral temperature above 102 F (38.9 C).   You are  feeling unwell or have chills.   You are having an increasingly difficult time with walking.   You have loss of sensation or other new symptoms.   You have questions or concerns.  MAKE SURE YOU:   Understand these instructions.   Will watch your condition.   Will get help right away if you are not doing well or get worse.  Document Released: 03/01/2010 Document Revised: 05/24/2011 Document Reviewed: 03/01/2010 [facility] Patient Information 2012 [place], [place].

## 2011-11-12 NOTE — ED Notes (Signed)
Patient reports that while attending church and following same developed severe pain to left foot with ambulation. Swelling noted to same.

## 2011-11-12 NOTE — ED Provider Notes (Signed)
History     CSN: [id number]  Arrival date & time 11/12/11  4696   First MD Initiated Contact with Patient 11/12/11 1956      No chief complaint on file.   (Consider location/radiation/quality/duration/timing/severity/associated sxs/prior treatment) The history is provided by the patient.   Patient presents with left foot pain. She states that this seemed to start this morning while attending church this morning. The pain is generalized. She was unable to bear weight after this started due to the pain. States the pain is sharp in nature and does not radiate. No known aggravating or alleviating factors. She denies any swelling to the calves, numbness, weakness. She apparently has had this in the past. She states that "I was tested and my rheumatoid test was high. They started me on prednisone."  Past Medical History  Diagnosis Date  . LAP-BAND surgery status   . Breast cancer   . Hypertension     Past Surgical History  Procedure Date  . Total knee arthroplasty   . Abdominal hysterectomy   . Mastectomy     No family history on file.  History  Substance Use Topics  . Smoking status: Never Smoker   . Smokeless tobacco: Not on file  . Alcohol Use: No    OB History    Grav Para Term Preterm Abortions TAB SAB Ect Mult Living                  Review of Systems  Constitutional: Negative for fever and chills.  Respiratory: Negative for chest tightness and shortness of breath.   Cardiovascular: Negative for chest pain, palpitations and leg swelling.  Musculoskeletal: Positive for myalgias and joint swelling.  Skin: Negative for color change and rash.  Neurological: Negative for dizziness, weakness and numbness.    Allergies  Review of patient's allergies indicates no known allergies.  Home Medications   Current Outpatient Rx  Name Route Sig Dispense Refill  . CALCIUM 600 + D PO Oral Take 1 tablet by mouth daily.      Marland Kitchen CETIRIZINE HCL 10 MG PO TABS Oral Take 10 mg by  mouth daily.      Marland Kitchen ESOMEPRAZOLE MAGNESIUM 40 MG PO CPDR Oral Take 40 mg by mouth daily before breakfast.      . HYDROCHLOROTHIAZIDE 25 MG PO TABS Oral Take 25 mg by mouth daily.      Marland Kitchen LOSARTAN POTASSIUM 100 MG PO TABS Oral Take 100 mg by mouth daily.      . ONE-A-DAY WOMENS 50 PLUS PO Oral Take 1 tablet by mouth daily.      Marland Kitchen PREDNISONE (PAK) 10 MG PO TABS Oral Take 10 mg by mouth daily.      Pulse 80  Temp(Src) 98.9 F (37.2 C) (Oral)  Resp 18  Ht 5\' 4"  (1.626 m)  SpO2 100%  Physical Exam  Nursing note and vitals reviewed. Constitutional: She is oriented to person, place, and time. She appears well-developed and well-nourished. No distress.  HENT:  Head: Normocephalic and atraumatic.  Neck: Normal range of motion.  Cardiovascular: Normal rate.   Pulmonary/Chest: Effort normal.  Musculoskeletal:       Legs obese. Mild generalized swelling to dorsum of foot. There are no erythema or ecchymoses noted. She is nontender to palpation over the foot and ankle. Full range of motion of the ankle with full strength. Dorsalis pedis and posterior tibial pulses intact. There is no tenderness to palpation or palpable cord over the calf. No  erythema noted.  Neurological: She is alert and oriented to person, place, and time.  Skin: Skin is warm and dry. No rash noted. She is not diaphoretic.  Psychiatric: She has a normal mood and affect.    ED Course  Procedures (including critical care time)  Labs Reviewed - No data to display Dg Foot Complete Left  11/12/2011  *RADIOLOGY REPORT*  Clinical Data: Left foot pain and swelling.  No injury.  LEFT FOOT - COMPLETE 3+ VIEW  Comparison: None.  Findings: Mild first MTP joint osteoarthritis is present.  Small cyst is present in the middle cuneiform bone, probably degenerative.  There is no fracture identified.  Calcaneal spurs incidentally noted.  Midfoot osteoarthritis. On the lateral view, mild ankle osteoarthritis is also present.  IMPRESSION: No  acute osseous abnormality.  MTP and midfoot osteoarthritis. Ankle osteoarthritis.  Original Report Authenticated By: Andreas Newport, M.D.  I personally reviewed the films.   1. Ankle pain, left       MDM  Discussed with Dr. Preston Fleeting. We reviewed the plain films together. Patient with mild swelling about the ankle joint. Mild ankle OA seen on plain film. Question whether this may be causing her pain. She has good pedal pulses, and there is no leg erythema. Calves are symmetrical, and there is no tenderness to palpation or palpable cord. Treated here with Toradol. Plan to place her in an ASO, have her use a walker, and followup with orthopedics for further evaluation. Return precautions discussed.       [place], [place] 11/13/11 [phone]

## 2011-11-13 NOTE — ED Provider Notes (Signed)
Medical screening examination/treatment/procedure(s) were performed by non-physician practitioner and as supervising physician I was immediately available for consultation/collaboration.   Murielle Stang, MD 11/13/11 2254 

## 2011-12-07 ENCOUNTER — Telehealth: Payer: Self-pay | Admitting: Oncology

## 2011-12-07 ENCOUNTER — Ambulatory Visit (HOSPITAL_BASED_OUTPATIENT_CLINIC_OR_DEPARTMENT_OTHER): Payer: BC Managed Care – PPO | Admitting: Oncology

## 2011-12-07 ENCOUNTER — Other Ambulatory Visit (HOSPITAL_BASED_OUTPATIENT_CLINIC_OR_DEPARTMENT_OTHER): Payer: Medicare Other | Admitting: Lab

## 2011-12-07 VITALS — BP 157/81 | HR 64 | Temp 98.4°F | Ht 64.0 in | Wt 285.4 lb

## 2011-12-07 DIAGNOSIS — C801 Malignant (primary) neoplasm, unspecified: Secondary | ICD-10-CM

## 2011-12-07 DIAGNOSIS — Z853 Personal history of malignant neoplasm of breast: Secondary | ICD-10-CM

## 2011-12-07 DIAGNOSIS — Z171 Estrogen receptor negative status [ER-]: Secondary | ICD-10-CM

## 2011-12-07 DIAGNOSIS — Z978 Presence of other specified devices: Secondary | ICD-10-CM

## 2011-12-07 DIAGNOSIS — Z803 Family history of malignant neoplasm of breast: Secondary | ICD-10-CM

## 2011-12-07 DIAGNOSIS — C50219 Malignant neoplasm of upper-inner quadrant of unspecified female breast: Secondary | ICD-10-CM

## 2011-12-07 DIAGNOSIS — E559 Vitamin D deficiency, unspecified: Secondary | ICD-10-CM

## 2011-12-07 DIAGNOSIS — M25569 Pain in unspecified knee: Secondary | ICD-10-CM

## 2011-12-07 DIAGNOSIS — C779 Secondary and unspecified malignant neoplasm of lymph node, unspecified: Secondary | ICD-10-CM

## 2011-12-07 DIAGNOSIS — Z9221 Personal history of antineoplastic chemotherapy: Secondary | ICD-10-CM

## 2011-12-07 DIAGNOSIS — C50919 Malignant neoplasm of unspecified site of unspecified female breast: Secondary | ICD-10-CM

## 2011-12-07 LAB — CBC WITH DIFFERENTIAL/PLATELET
Basophils Absolute: 0 10*3/uL (ref 0.0–0.1)
HCT: 37.7 % (ref 34.8–46.6)
HGB: 12 g/dL (ref 11.6–15.9)
MONO#: 0.4 10*3/uL (ref 0.1–0.9)
NEUT%: 72.8 % (ref 38.4–76.8)
Platelets: 237 10*3/uL (ref 145–400)
WBC: 8.8 10*3/uL (ref 3.9–10.3)
lymph#: 2 10*3/uL (ref 0.9–3.3)

## 2011-12-07 NOTE — Telephone Encounter (Signed)
gve the pt her march 2014 appt calendar. Pt is aware she will be contacted with the mri breast appt for august. lmonvm with kathy mcconell from the [facility]

## 2011-12-07 NOTE — Progress Notes (Signed)
Hematology and Oncology Follow Up Visit  [name] [id number] 01-Jul-1961 51 y.o. 12/07/2011 11:16 AM PCP  Principle Diagnosis: PROBLEM:  1.  History of breast cancer, status post bilateral mastectomy, on adjuvant chemotherapy with [facility], last cycle of Herceptin on 05/09/2008.  ER/PR negative. 2.  History of obesity status post lap-band surgery on August 03, 2008. 3.  Strong family history of breast cancer status post bilateral oophorectomies. 4.  History of recent bilateral breast reconstruction.   Interim History:  There have been no intercurrent illness, hospitalizations or medication changes. Feels well , no intercurrent problems. On prednisone per dr Nickola Major for knee pain. Medications: I have reviewed the patient's current medications.  Allergies: No Known Allergies  Past Medical History, Surgical history, Social history, and Family History were reviewed and updated.  Review of Systems: Constitutional:  Negative for fever, chills, night sweats, anorexia, weight loss, pain. Cardiovascular: negative Respiratory: negative Neurological: negative Dermatological: negative ENT: negative Skin Gastrointestinal: no abdominal pain, change in bowel habits, or black or bloody stools Genito-Urinary: negative Hematological and Lymphatic: negative  Breast: negative Musculoskeletal: negative, apart from knee pain Remaining ROS negative.  Physical Exam: Blood pressure 157/81, pulse 64, temperature 98.4 F (36.9 C), temperature source Oral, height 5\' 4"  (1.626 m), weight 285 lb 6.4 oz (129.457 kg). ECOG: 0 General appearance: alert, cooperative and appears stated age Head: Normocephalic, without obvious abnormality, atraumatic Neck: no adenopathy, no carotid bruit, no JVD, supple, symmetrical, trachea midline and thyroid not enlarged, symmetric, no tenderness/mass/nodules Lymph nodes: Cervical, supraclavicular, and axillary nodes normal. Cardiac : regular rate and rhythm, no murmurs  or gallops Pulmonary:clear to auscultation bilaterally and normal percussion bilaterally Breasts:s/p bil mrm withimplants- no evidence of recurrence. Abdomen:soft, non-tender; bowel sounds normal; no masses,  no organomegaly Extremities negative Neuro: alert, oriented, normal speech, no focal findings or movement disorder noted  Lab Results: Lab Results  Component Value Date   WBC 8.8 12/07/2011   HGB 12.0 12/07/2011   HCT 37.7 12/07/2011   MCV 87.1 12/07/2011   PLT 237 12/07/2011     Chemistry      Component Value Date/Time   NA 137 09/23/2011 2036   K 3.4* 09/23/2011 2036   CL 99 09/23/2011 2036   CO2 28 09/23/2011 2036   BUN 10 09/23/2011 2036   CREATININE 0.57 09/23/2011 2036      Component Value Date/Time   CALCIUM 9.7 09/23/2011 2036   ALKPHOS 94 09/23/2011 2036   AST 17 09/23/2011 2036   ALT 17 09/23/2011 2036   BILITOT 0.2* 09/23/2011 2036      .pathology. Radiological Studies: chest X-ray n/a Mammogram n/a Bone density Due in 2014  Impression and Plan: Patient is doing well. She has gained a little weight because she is on steroids for knee pain. This has not is no obvious evidence of recurrence. 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  I will start seeing her at yearly intervals.   More than 50% of the visit was spent in patient-related counselling   Pierce Crane, MD 3/14/201311:16 AM

## 2011-12-08 ENCOUNTER — Telehealth: Payer: Self-pay | Admitting: Oncology

## 2011-12-08 LAB — COMPREHENSIVE METABOLIC PANEL
Albumin: 4.4 g/dL (ref 3.5–5.2)
BUN: 14 mg/dL (ref 6–23)
CO2: 27 mEq/L (ref 19–32)
Calcium: 9.7 mg/dL (ref 8.4–10.5)
Chloride: 101 mEq/L (ref 96–112)
Glucose, Bld: 155 mg/dL — ABNORMAL HIGH (ref 70–99)
Potassium: 3.9 mEq/L (ref 3.5–5.3)

## 2011-12-08 LAB — CANCER ANTIGEN 27.29: CA 27.29: 22 U/mL (ref 0–39)

## 2011-12-08 LAB — VITAMIN D 25 HYDROXY (VIT D DEFICIENCY, FRACTURES): Vit D, 25-Hydroxy: 35 ng/mL (ref 30–89)

## 2011-12-08 NOTE — Telephone Encounter (Signed)
Sent anistaff message to dr Donnie Coffin to re-order the mri breast exam per the breast center

## 2012-06-14 ENCOUNTER — Telehealth (INDEPENDENT_AMBULATORY_CARE_PROVIDER_SITE_OTHER): Payer: Self-pay | Admitting: General Surgery

## 2012-06-14 NOTE — Telephone Encounter (Signed)
Patient called in stating she has had reflux and nausea and vomiting for months. She has a lap band and has not followed up for awhile. I made first available appt with Dr Ezzard Standing for July 25, 2012. She is requesting sooner if she can be worked in anytime. I told her I would pass along the request.

## 2012-07-09 ENCOUNTER — Encounter (INDEPENDENT_AMBULATORY_CARE_PROVIDER_SITE_OTHER): Payer: Self-pay | Admitting: Surgery

## 2012-07-25 ENCOUNTER — Encounter (INDEPENDENT_AMBULATORY_CARE_PROVIDER_SITE_OTHER): Payer: Self-pay | Admitting: Surgery

## 2012-10-10 ENCOUNTER — Ambulatory Visit (INDEPENDENT_AMBULATORY_CARE_PROVIDER_SITE_OTHER): Payer: Medicare PPO | Admitting: Surgery

## 2012-10-10 VITALS — BP 130/78 | HR 69 | Temp 98.4°F | Resp 18 | Ht 64.0 in | Wt 292.0 lb

## 2012-10-10 DIAGNOSIS — Z4651 Encounter for fitting and adjustment of gastric lap band: Secondary | ICD-10-CM

## 2012-10-10 DIAGNOSIS — Z9884 Bariatric surgery status: Secondary | ICD-10-CM

## 2012-10-10 NOTE — Progress Notes (Signed)
[facility]  Ovidio Kin, MD,  FACS [address] [place]  [address] [place], [place]    [address] Phone:  [phone] FAX:  [phone]   Re:   RAFAELLA KOLE DOB:   10/12/1960 MRN:   [id number]  ASSESSMENT AND PLAN: 1.  History of lap band - APS - 08/04/2008  I had not seen [name] since March 2011  She had 1.0 cc in her lap band, I added 3.0 cc for a total of 4.0 cc in the lap band.  We will arrange for her to see the nutritionist.  We talked about exercise.  She will see me back in 3 months.  2.  History of bilateral mastectomies/bilateral breast cancer - 2008  Left side - T2, N1a, right - T1b, N1a  Was seeing Mervin Hack for oncology.  Dr. Shon Hough placed bilateral tissue expanders last year. 3.  BRCA 2 postivie 4.  Hypertension 5.  Bilateral knee replacement - 2010 - M. Olin  Her left knee is bothering her.  She said that she needs to get back to Dr. Charlann Boxer. 6. History of back issues. - Dr. Ethelene Hal 7.  Rheumatoid arthritis  Sees Dr. Lendon Colonel  On methotrexate 8.  Mass left abdomen  She could not find it today and I could not feel anything on PE. 9.  She had a tooth removed yesterday.  HISTORY OF PRESENT ILLNESS: Chief Complaint  Patient presents with  . Lap Band Fill   [name] is a 52 y.o. (DOB: [birth year])  AA female who is a patient of MCNEILL,WENDY, MD and comes to me today for Lap Band eval.  To get to me today took some doing.  Her car broke down, so she borrowed a neighbor's car.  She had a tooth removed yesterday - took oxycodone, which made her nauseated.  But she seems to be in good spirits.  I last saw [name] in March 2011.  The biggest problem that the patient had in 2013 tissues placed on prednisone to control the symptoms of rheumatoid arthritis. She says the prednisone made her crave sweets, she gained weight, and had trouble with nausea on the prednisone.  She stopped the Prednisone in October, 2013,  is now on methotrexate and  doing better.  She had trouble on the Prednisone with eating, but has no resistance now.  She has some limit in her activity because of her RA and her left knee.  She has been trying to do water aerobics.  She has also felt a lump in her left abdomen that she wants to show me, but she could not feel it today.  She also asked about a friend who had a gastric bypass by me (!) and was interested in a lap band over the bypass.  I told her that I would have to talk to her.  Past Medical History  Diagnosis Date  . LAP-BAND surgery status   . Breast cancer   . Hypertension    Current Outpatient Prescriptions  Medication Sig Dispense Refill  . Calcium Carbonate-Vitamin D (CALCIUM 600 + D PO) Take 1 tablet by mouth daily.        . hydrochlorothiazide (HYDRODIURIL) 25 MG tablet Take 25 mg by mouth daily.        Marland Kitchen losartan (COZAAR) 100 MG tablet Take 100 mg by mouth daily.        . methotrexate (RHEUMATREX) 2.5 MG tablet       . Multiple Vitamins-Minerals (  ONE-A-DAY WOMENS 50 PLUS PO) Take 1 tablet by mouth daily.        . cetirizine (ZYRTEC) 10 MG tablet Take 10 mg by mouth daily.        Marland Kitchen esomeprazole (NEXIUM) 40 MG capsule Take 40 mg by mouth daily before breakfast.        . HYDROcodone-acetaminophen (NORCO/VICODIN) 5-325 MG per tablet       . predniSONE (STERAPRED UNI-PAK) 10 MG tablet Take 10 mg by mouth daily.       Social History:  PHYSICAL EXAM: BP 130/78  Pulse 69  Temp 98.4 F (36.9 C) (Oral)  Resp 18  Ht 5\' 4"  (1.626 m)  Wt 292 lb (132.45 kg)  BMI 50.12 kg/m2  General: Obese AA F who is alert. HEENT: Normal. Pupils equal. She had a tooth removed yesterday. Neck: Supple. No mass.  No thyroid mass.  Carotid pulse okay with no bruit. Lymph Nodes:  No supraclavicular or cervical nodes. Lungs: Clear to auscultation and symmetric breath sounds. Heart:  RRR. No murmur or rub.  Abdomen: Soft. No mass. No tenderness. No hernia. Normal bowel sounds.  Lap band port in RUQ.  I could  not feel the mass in the left abdomen of the patient and she could not feel it either.  Procedure:  She had only 1.0 cc in her lap band, I added 3.0 cc for a total of 4.0 cc in the lap band.  She tolerated water.  DATA REVIEWED: Epic notes.  Ovidio Kin, MD, FACS Office:  [phone]

## 2012-10-24 ENCOUNTER — Telehealth (INDEPENDENT_AMBULATORY_CARE_PROVIDER_SITE_OTHER): Payer: Self-pay

## 2012-10-24 NOTE — Telephone Encounter (Signed)
Patient states she can not come in today but she  agreed to come in 10/31/12 @ 3p for lap band  assessment.

## 2012-10-31 ENCOUNTER — Encounter (INDEPENDENT_AMBULATORY_CARE_PROVIDER_SITE_OTHER): Payer: Self-pay | Admitting: Surgery

## 2012-10-31 ENCOUNTER — Ambulatory Visit (INDEPENDENT_AMBULATORY_CARE_PROVIDER_SITE_OTHER): Payer: Medicare PPO | Admitting: Surgery

## 2012-10-31 ENCOUNTER — Telehealth: Payer: Self-pay | Admitting: *Deleted

## 2012-10-31 VITALS — HR 62 | Temp 97.2°F | Resp 18 | Ht 64.0 in | Wt 290.6 lb

## 2012-10-31 DIAGNOSIS — Z9884 Bariatric surgery status: Secondary | ICD-10-CM

## 2012-10-31 NOTE — Progress Notes (Signed)
[facility]  Ovidio Kin, MD,  FACS [address] [place]  [address] [place], [place]    [address] Phone:  [phone] FAX:  [phone]   Re:   [name] DOB:   1961/03/08 MRN:   [id number]  ASSESSMENT AND PLAN: 1.  History of lap band - APS - 08/04/2008  I had not seen Ms. Winslett since March 2011 -  But I just saw her 10/10/2012 .... So this visit may be a little premature.  She had 3.2 cc in her lap band, I added 1.2 cc for a total of 4.4 cc in the lap band.  We will arrange for her to see the nutritionist.  We talked about exercise.  She does water aerobics 1 per week and "silver sneakers" twice per week at the [place] [facility].  She will see me back in 10 weeks.  2.  History of bilateral mastectomies/bilateral breast cancer - 2008  Left side - T2, N1a, right - T1b, N1a  Was seeing Mervin Hack for oncology.  Dr. Shon Hough placed bilateral tissue expanders last year. 3.  BRCA 2 postivie 4.  Hypertension 5.  Bilateral knee replacement - 2010 - M. Olin  Her left knee is bothering her.  She said that she needs to get back to Dr. Charlann Boxer. 6. History of back issues. - Dr. Ethelene Hal 7.  Rheumatoid arthritis  Sees Dr. Lendon Colonel  On methotrexate 8.  Mass left abdomen  She could not find it today and I could not feel anything on PE. 9.  She had a tooth removed yesterday.  HISTORY OF PRESENT ILLNESS: Chief Complaint  Patient presents with  . Lap Band Fill    LBF-band isn't tight enough   [name] is a 52 y.o. (DOB: Jan 26, 1961)  AA female who is a patient of MCNEILL,WENDY, MD and comes to me today because she feels no resistance with her lap band.  She got in today because she "felt no resistance" - but it has only been a couple of weeks.  Recent history: I last saw Ms. Saylor in March 2011.  The biggest problem that the patient had in 2013 tissues placed on prednisone to control the symptoms of rheumatoid arthritis. She says the prednisone made her crave sweets, she gained  weight, and had trouble with nausea on the prednisone.  She stopped the Prednisone in October, 2013,  is now on methotrexate and doing better.  She had trouble on the Prednisone with eating, but has no resistance now.  She has some limit in her activity because of her RA and her left knee.  She has been trying to do water aerobics.  She has also felt a lump in her left abdomen that she wants to show me, but she could not feel it today.  Past Medical History  Diagnosis Date  . LAP-BAND surgery status   . Breast cancer   . Hypertension    Current Outpatient Prescriptions  Medication Sig Dispense Refill  . Calcium Carbonate-Vitamin D (CALCIUM 600 + D PO) Take 1 tablet by mouth daily.        . cetirizine (ZYRTEC) 10 MG tablet Take 10 mg by mouth daily.        Marland Kitchen esomeprazole (NEXIUM) 40 MG capsule Take 40 mg by mouth daily before breakfast.        . folic acid (FOLVITE) 1 MG tablet       . hydrochlorothiazide (HYDRODIURIL) 25 MG tablet Take 25 mg by mouth daily.        Marland Kitchen  HYDROcodone-acetaminophen (NORCO/VICODIN) 5-325 MG per tablet       . losartan (COZAAR) 100 MG tablet Take 100 mg by mouth daily.        . methotrexate (RHEUMATREX) 2.5 MG tablet       . Multiple Vitamins-Minerals (ONE-A-DAY WOMENS 50 PLUS PO) Take 1 tablet by mouth daily.        . predniSONE (STERAPRED UNI-PAK) 10 MG tablet Take 10 mg by mouth daily.       Social History: Unmarried.  22 yo daughter lives with her. She is on disability for arthritic/joint issues.  PHYSICAL EXAM: Pulse 62  Temp 97.2 F (36.2 C) (Temporal)  Resp 18  Ht 5\' 4"  (1.626 m)  Wt 290 lb 9.6 oz (131.815 kg)  BMI 49.88 kg/m2  General: Obese AA F who is alert.  Abdomen: Soft. No mass. No tenderness. No hernia. Normal bowel sounds.  Lap band port in RUQ.  I could not feel the mass in the left abdomen of the patient and she could not feel it either.  She again tried to show me her LUQ mass, but I do not feel much.  Procedure:  She had 3.2 cc in  her lap band, I added 1.2 cc for a total of 4.4 cc in the lap band.  She tolerated water.  DATA REVIEWED: None new.  Ovidio Kin, MD, FACS Office:  [phone]

## 2012-10-31 NOTE — Telephone Encounter (Signed)
Received call from patient to reschedule her appt. Confirmed appt. For 12/10/12 at 0845 with Melissa Cross,NP.  Then will become Dr. Darnelle Catalan.

## 2012-11-01 ENCOUNTER — Encounter: Payer: Self-pay | Admitting: Oncology

## 2012-11-04 ENCOUNTER — Encounter: Payer: Self-pay | Admitting: *Deleted

## 2012-11-04 ENCOUNTER — Encounter: Payer: Medicare PPO | Attending: Surgery | Admitting: *Deleted

## 2012-11-04 DIAGNOSIS — Z713 Dietary counseling and surveillance: Secondary | ICD-10-CM | POA: Insufficient documentation

## 2012-11-04 DIAGNOSIS — Z9884 Bariatric surgery status: Secondary | ICD-10-CM | POA: Insufficient documentation

## 2012-11-04 DIAGNOSIS — Z09 Encounter for follow-up examination after completed treatment for conditions other than malignant neoplasm: Secondary | ICD-10-CM | POA: Insufficient documentation

## 2012-11-04 NOTE — Patient Instructions (Addendum)
Goals:  Eat 3-6 small meals/snacks, every 3-5 hrs  Increase lean protein foods to meet 60-80g goal  Aim for 15-20 grams of carbohydrate (fruit, whole grain/starch) with meals and 15 grams with snacks  Always have protein source with carbs.  Aim for >30 min of physical activity daily  Take 2-3 (500 mg) doses of calcium citrate daily  Consider switching to sublingual B-12 ([facility] [place]; 350-500 mcg)  Use www.calorieking.com for food label information

## 2012-11-04 NOTE — Progress Notes (Signed)
  Follow-up visit:  4+ Years Post-Operative LAGB Surgery  Medical Nutrition Therapy:  Appt start time:  0845   End time:  1000.  Primary concerns today: Post-operative Bariatric Surgery Nutrition Management. [name] is here today for LAGB refresher to get back on track. Excessive CHO intake noted d/t lack of knowledge regarding portion sizes.  Reports 6 mo treatment of prednisone last year (starting 08/2011) after new dx of RA, which caused her to "crave sweets" and gain 20+ lbs. Was also not exercising during holidays, though she resumed 2 weeks ago. Knees and arthritis bothering her today. Plans to return to Dr. Charlann Boxer for f/u.  Surgery date:  08/04/08 Surgery type:  LAGB Start weight: ~ 310 lbs Lowest pt-reported post-op weight: 250 lbs  Weight today: 292.5 lbs Total weight lost:  17.5 lbs  TANITA  BODY COMP RESULTS  11/04/12   BMI (kg/m^2) 50.2   Fat Mass (lbs) 147.5   Fat Free Mass (lbs) 145.0   Total Body Water (lbs) 106.0   24-hr recall: B (AM): Oatmeal (maple brown sugar) 1 pkt w/ 3 slices apple = 4g Snk (AM): Granola bar (Honey & Oats) OR 1 oz peanuts (after gym) = 4-7g  L (PM): Sandwich (2 pcs wheat bread, deli meat, lettuce, cheese, mayo) = 20g Snk (PM): Apple or mini bag popcorn = 0g D (PM): 3 veggies (squash, spinach, greens OR potatoes, carrots, onions); sometimes eats 2-3 oz of meat with it = 0-20g Snk (PM): 2-4 oz Chex mix = 3g  Fluid intake:  Water 64-128 oz; reg coffee 15-20 oz; crystal light Estimated total protein intake: 30-55g  Medications: See medication list Supplementation: Takes MVI (One A Day 50+). Discussed changing to one with iron. Also takes 600 mg calcium (carbonate or citrate??) daily. Discussed increasing calcium and verifying it is citrate.  Using straws: Yes, sometimes Drinking while eating:  No Hair loss:  Thinning; may be d/t methotrexate or low protein intake Carbonated beverages:  No N/V/D/C: No Last Lap-Band fill:  10/10/12 - 1.2 cc added  for total of 4.4 cc. Reports she feels "a little" restriction and is borderline green/yellow zones.  Recent physical activity:  Water aerobics 1 day/week; Silver Sneakers program 2 days/week (cardio, strength trng - chair exercises)  Progress Towards Goal(s):  In progress.  Handouts given during visit include:  Meal Planning Card  Low CHO Snack List  [facility] Bariatric Supplement Price List   Nutritional Diagnosis:  [place]-3.3 Overweight/obesity related to excessive carb intake and medication regimen as evidenced by 20 lb weight gain after 6 month treatment of prednisone s/p LAGB surgery.    Intervention:  Nutrition education.  Monitoring/Evaluation:  Dietary intake, exercise, lap band fills, and body weight. Follow up in 1 month.

## 2012-12-02 ENCOUNTER — Ambulatory Visit: Payer: Self-pay | Admitting: *Deleted

## 2012-12-09 ENCOUNTER — Telehealth: Payer: Self-pay | Admitting: *Deleted

## 2012-12-09 NOTE — Telephone Encounter (Signed)
Pt called stating that she is not sure if weather is going to be bad tomorrow, but does not want to chance it. Confirmed rescheduled appt for 12/30/12 w/ pt.

## 2012-12-10 ENCOUNTER — Other Ambulatory Visit: Payer: Self-pay | Admitting: Lab

## 2012-12-10 ENCOUNTER — Ambulatory Visit: Payer: Self-pay | Admitting: Oncology

## 2012-12-10 ENCOUNTER — Ambulatory Visit: Payer: Self-pay | Admitting: Gynecologic Oncology

## 2012-12-27 ENCOUNTER — Telehealth: Payer: Self-pay | Admitting: Oncology

## 2012-12-27 NOTE — Telephone Encounter (Signed)
, °

## 2012-12-30 ENCOUNTER — Ambulatory Visit: Payer: Self-pay | Admitting: Gynecologic Oncology

## 2013-01-07 ENCOUNTER — Ambulatory Visit: Payer: Self-pay | Admitting: *Deleted

## 2013-01-09 ENCOUNTER — Encounter (INDEPENDENT_AMBULATORY_CARE_PROVIDER_SITE_OTHER): Payer: Medicare PPO | Admitting: Surgery

## 2013-02-03 ENCOUNTER — Other Ambulatory Visit: Payer: Self-pay | Admitting: Oncology

## 2013-02-03 ENCOUNTER — Telehealth: Payer: Self-pay | Admitting: *Deleted

## 2013-02-03 NOTE — Telephone Encounter (Signed)
Lm informing the pt that she needs to come at 2 pm instead of 3 pm. i also made her aware that i will mail her a letter/cal as well.Marland Kitchentd

## 2013-02-12 ENCOUNTER — Ambulatory Visit: Payer: Self-pay | Admitting: Family

## 2013-02-12 ENCOUNTER — Ambulatory Visit (HOSPITAL_BASED_OUTPATIENT_CLINIC_OR_DEPARTMENT_OTHER): Payer: Medicare PPO | Admitting: Family

## 2013-02-12 ENCOUNTER — Telehealth: Payer: Self-pay | Admitting: Oncology

## 2013-02-12 ENCOUNTER — Encounter: Payer: Self-pay | Admitting: Family

## 2013-02-12 ENCOUNTER — Ambulatory Visit (HOSPITAL_BASED_OUTPATIENT_CLINIC_OR_DEPARTMENT_OTHER): Payer: Medicare PPO | Admitting: Lab

## 2013-02-12 ENCOUNTER — Ambulatory Visit: Payer: Self-pay | Admitting: Oncology

## 2013-02-12 VITALS — BP 141/84 | HR 64 | Temp 98.4°F | Resp 20 | Ht 64.0 in | Wt 295.2 lb

## 2013-02-12 DIAGNOSIS — Z853 Personal history of malignant neoplasm of breast: Secondary | ICD-10-CM

## 2013-02-12 DIAGNOSIS — C50919 Malignant neoplasm of unspecified site of unspecified female breast: Secondary | ICD-10-CM

## 2013-02-12 LAB — COMPREHENSIVE METABOLIC PANEL (CC13)
ALT: 19 U/L (ref 0–55)
AST: 17 U/L (ref 5–34)
Albumin: 3.8 g/dL (ref 3.5–5.0)
Alkaline Phosphatase: 93 U/L (ref 40–150)
BUN: 13.2 mg/dL (ref 7.0–26.0)
CO2: 30 mEq/L — ABNORMAL HIGH (ref 22–29)
Calcium: 10.2 mg/dL (ref 8.4–10.4)
Chloride: 103 mEq/L (ref 98–107)
Creatinine: 0.8 mg/dL (ref 0.6–1.1)
Glucose: 97 mg/dl (ref 70–99)
Potassium: 3.5 mEq/L (ref 3.5–5.1)
Sodium: 142 mEq/L (ref 136–145)
Total Bilirubin: 0.22 mg/dL (ref 0.20–1.20)
Total Protein: 7.6 g/dL (ref 6.4–8.3)

## 2013-02-12 LAB — CBC WITH DIFFERENTIAL/PLATELET
BASO%: 0.4 % (ref 0.0–2.0)
LYMPH%: 34.6 % (ref 14.0–49.7)
MCHC: 32.4 g/dL (ref 31.5–36.0)
MONO#: 0.5 10*3/uL (ref 0.1–0.9)
Platelets: 226 10*3/uL (ref 145–400)
RBC: 3.95 10*6/uL (ref 3.70–5.45)
WBC: 6.7 10*3/uL (ref 3.9–10.3)

## 2013-02-12 LAB — LACTATE DEHYDROGENASE (CC13): LDH: 193 U/L (ref 125–245)

## 2013-02-12 NOTE — Progress Notes (Signed)
[facility]  Telephone:([phone]) [phone] Fax:(336) [phone]  OFFICE PROGRESS NOTE   ID: [name]   DOB: 1961-09-07  MR#: [id number]  QMV#:[id number]   PCP: Veronica Dimitri, MD GYN: [name], M.D. SU: [name], M.D./[name], M.D./[name], M.D. OTHER MD:   HISTORY OF PRESENT ILLNESS: From Dr. Theron Arista Duffy's the patient evaluation note dated 11/07/2006: "This is a 52 year old woman who is referred by Dr. Derrell Duffy for evaluation and treatment of breast cancer.  This woman has a strong family history of breast cancer and noted a mass in her left breast about six weeks ago.  She has had previous mammograms on an annual basis.  She denies any nipple retractions, skin changes or nipple discharge.  A mammogram was performed on 10/26/2006, ultrasound was performed as well.  This essentially showed a 3.4 cm mass upper outer right quadrant of the left breast.  On physical examination a firm nodule was palpable at 11 o'clock position 14 cm from the nipple.  Dimensions of this were 3.0 x 1.4 x 2.3 cm.  No large axillary lymph nodes were identified.  A biopsy was recommended and performed the same day.  This essentially showed high-grade invasive ductal cancer, nuclear grade 3 of 3 with no significant tubular formation.  Prognostic panel showed this to be ER/PR negative, HER-2 is 2+, FISH is pending, proliferative index 49%.  The patient has been referred for neoadjuvant chemotherapy."  Her subsequent history is as detailed below  INTERVAL HISTORY: Dr. Darnelle Duffy and I saw [name] today for follow up of bilateral invasive ductal carcinoma.  The patient was last seen by Dr. Donnie Duffy on 12/07/2011.  Since her last office visit, the patient has been doing relatively well.  She is establishing herself with Dr. Darrall Duffy service today.  REVIEW OF SYSTEMS: A 10 point review of systems was completed and is negative except ongoing chronic arthritis particularly in her back,  generalized head to toe myalgias and arthralgias, ongoing headaches and occasional constipation.  The patient denies any other symptomatology.   PAST MEDICAL HISTORY: Past Medical History  Diagnosis Date  . LAP-BAND surgery status   . Hypertension   . Morbid obesity   . Breast cancer 2008  . Arthritis   . Rheumatoid arthritis 2013    Feet, joints, hands  . RA (rheumatoid arthritis) 2013  . GERD (gastroesophageal reflux disease)   . Allergy   Has a history of arthritis involving her knees.  She has had previous arthroscopic type intervention.  She has had recent injection of her right knee.    PAST SURGICAL HISTORY: Past Surgical History  Procedure Laterality Date  . Total knee arthroplasty Bilateral 12/2009; 02/2010  . Abdominal hysterectomy    . Mastectomy Bilateral 2008  . Laparoscopic gastric banding  07/2008  . Cesarean section  1979 and 1978  . Tympanic membrane repair Left 1983    Ear drum plate  . Breast reconstruction Bilateral 06/2010  Includes two caesarian sections, arthroscopic surgery of her knee and an intervention for a perforated eardrum.   FAMILY HISTORY Family History  Problem Relation Age of Onset  . Cancer Mother 45    Breast cancer  . Cancer Father     Colon cancer  . Cancer Sister 71    Breast cancer  . Diabetes Sister   . Cancer Maternal Aunt     Breast cancer  . Cancer Paternal Aunt     Breast cancer  . Cancer Cousin  Breast cancer  Father died of colon cancer.  He had four sisters.  Mother had breast cancer in the 34s and died of cancer in her late 13s.  Unclear whether she had got a gynecologic malignancy as well.  She had three sisters and five brothers.  The patient has a sister who was diagnosed with breast cancer at age 48, currently living in [place], she has two maternal aunts with breast cancer, a paternal aunt with breast cancer.  She also has a maternal cousin who has bilateral breast cancer, and first cancer at age 58.  She  apparently has had the genetic testing as positive for the gene.  GYNECOLOGIC HISTORY: She is gravida 2, para 2, menarche unknown age.  Four years ago she was seen by [name].  She apparently has a history of menorrhagia and previously had a biopsy of what sounds like an endometrial polyp.  SOCIAL HISTORY: [name] has been divorced since 2007.  Previously married twice, her first marriage was for five years, her second marriage was for 15 years.  She has two children, an adult son that lives in [place] and a teenage daughter.  The patient is a retired [employer], school bus driver.  In her spare time she enjoys participating in water aerobics, cooking, going to church, and spending time with her family.   ADVANCED DIRECTIVES: Not on file  HEALTH MAINTENANCE: History  Substance Use Topics  . Smoking status: Never Smoker   . Smokeless tobacco: Never Used  . Alcohol Use: No    Colonoscopy: The patient states that she'll be due for another colonoscopy in 2015. PAP: 03/11/2007 Bone density: The patient's last bone density examination on 12/07/2010 showed a T score of -0.9 (normal). Lipid panel: Not on file  No Known Allergies  Current Outpatient Prescriptions  Medication Sig Dispense Refill  . Calcium Carbonate-Vitamin D (CALCIUM 600 + D PO) Take 1 tablet by mouth daily.        . cetirizine (ZYRTEC) 10 MG tablet Take 10 mg by mouth daily.       . folic acid (FOLVITE) 1 MG tablet Take 1 mg by mouth daily.       . hydrochlorothiazide (HYDRODIURIL) 25 MG tablet Take 25 mg by mouth daily.        Marland Kitchen losartan (COZAAR) 50 MG tablet Take 50 mg by mouth daily.      . methocarbamol (ROBAXIN) 500 MG tablet Take 500 mg by mouth as needed.      . methotrexate (RHEUMATREX) 2.5 MG tablet Take 25 mg by mouth once a week.       . Multiple Vitamins-Minerals (ONE-A-DAY WOMENS 50 PLUS PO) Take 1 tablet by mouth daily.       . Omeprazole (PRILOSEC PO) Take by mouth as needed.       No  current facility-administered medications for this visit.    OBJECTIVE: Filed Vitals:   02/12/13 1353  BP: 141/84  Pulse: 64  Temp: 98.4 F (36.9 C)  Resp: 20     Body mass index is 50.65 kg/(m^2).      ECOG FS: 1 - Symptomatic but completely ambulatory  General appearance: Alert, cooperative, well nourished, morbid obesity, no apparent distress Head: Normocephalic, without obvious abnormality, atraumatic, dental caries Eyes: Conjunctivae/corneas clear, PERRLA, EOMI Nose: Nares, septum and mucosa are normal, no drainage or sinus tenderness Neck: No adenopathy, supple, symmetrical, trachea midline, thyroid not enlarged, no tenderness Resp: Clear to auscultation bilaterally Cardio: Regular  rate and rhythm, S1, S2 normal, no murmur, click, rub or gallop Breasts: Bilateral mastectomies with bilateral breast reconstruction, palpable "ports" on the lower lateral outer quadrant/by axillary areas of each breast from reconstructive surgery, well-healed surgical scars bilaterally, bilateral axillary fullness GI: Soft, distended, non-tender, hypoactive bowel sounds, left upper quadrant hernia/mass, right lower quadrant lap band palpable, excessive habitus  Extremities: Extremities normal, atraumatic, no cyanosis, bilateral lower extremity 3+ edema Lymph nodes: Cervical, supraclavicular, and axillary nodes normal Neurologic: Grossly normal    LAB RESULTS: Lab Results  Component Value Date   WBC 6.7 02/12/2013   NEUTROABS 3.8 02/12/2013   HGB 11.3* 02/12/2013   HCT 34.8 02/12/2013   MCV 88.1 02/12/2013   PLT 226 02/12/2013      Chemistry      Component Value Date/Time   NA 142 02/12/2013 1559   NA 139 12/07/2011 1010   K 3.5 02/12/2013 1559   K 3.9 12/07/2011 1010   CL 103 02/12/2013 1559   CL 101 12/07/2011 1010   CO2 30* 02/12/2013 1559   CO2 27 12/07/2011 1010   BUN 13.2 02/12/2013 1559   BUN 14 12/07/2011 1010   CREATININE 0.8 02/12/2013 1559   CREATININE 0.76 12/07/2011 1010       Component Value Date/Time   CALCIUM 10.2 02/12/2013 1559   CALCIUM 9.7 12/07/2011 1010   ALKPHOS 93 02/12/2013 1559   ALKPHOS 89 12/07/2011 1010   AST 17 02/12/2013 1559   AST 15 12/07/2011 1010   ALT 19 02/12/2013 1559   ALT 21 12/07/2011 1010   BILITOT 0.22 02/12/2013 1559   BILITOT 0.2* 12/07/2011 1010       Lab Results  Component Value Date   LABCA2 22 12/07/2011    Urinalysis    Component Value Date/Time   COLORURINE YELLOW 03/01/2009 0800   APPEARANCEUR CLOUDY* 03/01/2009 0800   LABSPEC 1.025 03/01/2009 0800   LABSPEC 1.020 08/02/2007 1322   PHURINE 7.0 03/01/2009 0800   GLUCOSEU NEGATIVE 03/01/2009 0800   HGBUR NEGATIVE 03/01/2009 0800   BILIRUBINUR NEGATIVE 03/01/2009 0800   KETONESUR NEGATIVE 03/01/2009 0800   PROTEINUR NEGATIVE 03/01/2009 0800   UROBILINOGEN 0.2 03/01/2009 0800   NITRITE NEGATIVE 03/01/2009 0800   LEUKOCYTESUR NEGATIVE MICROSCOPIC NOT DONE ON URINES WITH NEGATIVE PROTEIN, BLOOD, LEUKOCYTES, NITRITE, OR GLUCOSE <1000 mg/dL. 03/01/2009 0800    STUDIES: No results found.  ASSESSMENT: 52 y[place]. [place]: 1.  Status post left breast needle core biopsy at 11 o'clock position on 10/26/2006 which showed invasive mammary carcinoma, PR 0%, PR 0%, Ki-67 49%, HER-2/neu 2+.  2.  Status post bilateral breast MRI on 11/09/2006 which showed no suspicious masses or enhancements were identified in the right breast.  No right axillary adenopathy.  No radiographic evidence of malignancy in the right.  The left breast had an irregular, enhancing mass within the deep superior portion of the breast which measured 3.2 x 2.0 x 2.2 cm.  There was also a separate 1.3 cm enhancing area within the deep inferior central left breast.  No left axillary adenopathy was seen.  Scattered lymph nodes within the upper outer quadrant were stable compared with prior mammogram.  3.  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  4.  Status post left modified radical mastectomy on 12/04/2006 which showed invasive ductal carcinoma, 3.3 cm, grade 3, lymphovascular space invasion  was present, no evidence of Paget's disease of the nipple, fibroadenoma 1.2 cm in the central inferior area of the breast, fibrocystic changes including fibrosis and ductal ectasia, left sentinel node biopsy showing one lymph node negative for tumor and one lymph node positive for metastatic ductal carcinoma, stage IIB, pT2, pN1a, pMX, ER 0%, PR 0%, Ki-67 49%, HER-2/neu 2+/ HER-2/neu by FISH amplification with a ratio of 2.05, 1/19 metastatic lymph nodes.  status post right breast mastectomy on 12/04/2006 which showed invasive ductal carcinoma, 0.6 cm, grade 3, fibrocystic changes including fibrosis, ductal ectasia and sclerotic lobules with rare calcification, no evidence of Paget's disease of the nipple, right axillary sentinel node resection, florid lymphoid hyperplasia, stage IIA, pT1b, pN1a, pMX.  ER 0%, PR 0%, Ki-67 50%, HER-2/neu negative by FISH no amplification, with 1/5 metastatic carcinoma lymph nodes identified and 1 micrometastatic lymph node identified.  5.  Status post adjuvant therapy with Herceptin that started on 02/06/2007 through 03/04/2008.  Concurrently the patient received adjuvant chemotherapy with Taxotere/Carboplatin starting on 05/10/2007 through 08/23/2007 x 6 cycles.  The patient also received Procrit during this time on 08/02/2007, 08/09/2007 and 08/23/2007. Echocardiogram on 05/14/2007 showed a left ventricular ejection fraction estimated between 55% to 60%.  6. Status post total abdominal hysterectomy with bilateral salpingo-oophorectomy and lysis of adhesions on 03/11/2007.    7.  Status post right axilla regional resection on 03/11/2007 in which 14 lymph nodes were examined with no evidence of metastatic carcinoma.  8.  Status post lap band  surgery on 08/04/2008.  9.  Status post bilateral breast reconstructive surgery in 06/2010.  PLAN: The patient is over 6 years from her time of diagnosis and Dr. Darnelle Duffy offered her to become a graduate of [facility]'s breast cancer program today. The patient declined graduation at this time and would like to see Dr. Darnelle Duffy at least one more year.  We plan to see the patient again in one year at which time we will check a CBC, CMP, LDH, and vitamin D level.   The patient requested a copy of her breast cancer journey timeline and this will be sent to her home address with a list of potassium rich foods as her potassium level was on the lower side of normal.  All questions were answered.  The patient was encouraged to contact us in the interim with any problems, questions or concerns.   Larina Bras, NP-C 02/13/2013, 4:04 PM

## 2013-02-12 NOTE — Patient Instructions (Addendum)
Please contact us at [phone] if you have any questions or concerns.  Please continue to do well and enjoy life!!!  Get plenty of rest, drink plenty of water, exercise daily (water aerobics or walking), eat a balanced diet.

## 2013-02-13 ENCOUNTER — Other Ambulatory Visit: Payer: Self-pay | Admitting: Family

## 2013-02-13 LAB — VITAMIN D 25 HYDROXY (VIT D DEFICIENCY, FRACTURES): Vit D, 25-Hydroxy: 30 ng/mL (ref 30–89)

## 2013-02-14 ENCOUNTER — Telehealth: Payer: Self-pay

## 2013-02-14 NOTE — Telephone Encounter (Signed)
Spoke with pt regarding lab results for 5/21. Per [facility], Vit D level on low side of normal. Advised to begin taking Vit D3 1000 ius daily. Pt voiced understanding and knows to call office with any questions. TMB

## 2013-02-25 ENCOUNTER — Ambulatory Visit: Payer: Self-pay | Admitting: *Deleted

## 2013-06-24 ENCOUNTER — Encounter (INDEPENDENT_AMBULATORY_CARE_PROVIDER_SITE_OTHER): Payer: Self-pay

## 2013-12-30 ENCOUNTER — Encounter (HOSPITAL_BASED_OUTPATIENT_CLINIC_OR_DEPARTMENT_OTHER): Payer: Self-pay | Admitting: *Deleted

## 2013-12-30 NOTE — Progress Notes (Signed)
12/30/13 1325  OBSTRUCTIVE SLEEP APNEA  Have you ever been diagnosed with sleep apnea through a sleep study? No  Do you snore loudly (loud enough to be heard through closed doors)?  0  Do you often feel tired, fatigued, or sleepy during the daytime? 0  Has anyone observed you stop breathing during your sleep? 0  Do you have, or are you being treated for high blood pressure? 1  BMI more than 35 kg/m2? 1  Age over 53 years old? 1  Neck circumference greater than 40 cm/18 inches? 1  Gender: 0  Obstructive Sleep Apnea Score 4  Score 4 or greater  Results sent to PCP

## 2013-12-30 NOTE — Progress Notes (Signed)
Pt denies any cardiac or resp or sleep apnea-will come in for [id number]

## 2013-12-31 ENCOUNTER — Other Ambulatory Visit: Payer: Self-pay

## 2013-12-31 ENCOUNTER — Encounter (HOSPITAL_BASED_OUTPATIENT_CLINIC_OR_DEPARTMENT_OTHER)
Admission: RE | Admit: 2013-12-31 | Discharge: 2013-12-31 | Disposition: A | Discharge status: Home / Self Care | Payer: Medicare PPO | Source: Ambulatory Visit | Attending: Specialist | Admitting: Specialist

## 2013-12-31 DIAGNOSIS — Z01810 Encounter for preprocedural cardiovascular examination: Secondary | ICD-10-CM | POA: Insufficient documentation

## 2014-01-05 ENCOUNTER — Encounter (HOSPITAL_BASED_OUTPATIENT_CLINIC_OR_DEPARTMENT_OTHER)
Admission: RE | Disposition: A | Discharge status: Home / Self Care | Payer: Self-pay | Source: Ambulatory Visit | Attending: Specialist

## 2014-01-05 ENCOUNTER — Encounter (HOSPITAL_BASED_OUTPATIENT_CLINIC_OR_DEPARTMENT_OTHER): Payer: Medicare PPO | Admitting: Anesthesiology

## 2014-01-05 ENCOUNTER — Ambulatory Visit (HOSPITAL_BASED_OUTPATIENT_CLINIC_OR_DEPARTMENT_OTHER): Payer: Medicare PPO | Admitting: Anesthesiology

## 2014-01-05 ENCOUNTER — Ambulatory Visit (HOSPITAL_BASED_OUTPATIENT_CLINIC_OR_DEPARTMENT_OTHER)
Admission: RE | Admit: 2014-01-05 | Discharge: 2014-01-05 | Disposition: A | Discharge status: Home / Self Care | Payer: Medicare PPO | Source: Ambulatory Visit | Attending: Specialist | Admitting: Specialist

## 2014-01-05 DIAGNOSIS — Z9884 Bariatric surgery status: Secondary | ICD-10-CM | POA: Insufficient documentation

## 2014-01-05 DIAGNOSIS — Z853 Personal history of malignant neoplasm of breast: Secondary | ICD-10-CM | POA: Insufficient documentation

## 2014-01-05 DIAGNOSIS — M069 Rheumatoid arthritis, unspecified: Secondary | ICD-10-CM | POA: Insufficient documentation

## 2014-01-05 DIAGNOSIS — L905 Scar conditions and fibrosis of skin: Secondary | ICD-10-CM | POA: Insufficient documentation

## 2014-01-05 DIAGNOSIS — Z96659 Presence of unspecified artificial knee joint: Secondary | ICD-10-CM | POA: Insufficient documentation

## 2014-01-05 DIAGNOSIS — Z9010 Acquired absence of unspecified breast and nipple: Secondary | ICD-10-CM | POA: Insufficient documentation

## 2014-01-05 DIAGNOSIS — Z6835 Body mass index (BMI) 35.0-35.9, adult: Secondary | ICD-10-CM | POA: Insufficient documentation

## 2014-01-05 DIAGNOSIS — K219 Gastro-esophageal reflux disease without esophagitis: Secondary | ICD-10-CM | POA: Insufficient documentation

## 2014-01-05 DIAGNOSIS — I10 Essential (primary) hypertension: Secondary | ICD-10-CM | POA: Insufficient documentation

## 2014-01-05 HISTORY — PX: BREAST RECONSTRUCTION: SHX9

## 2014-01-05 SURGERY — RECONSTRUCTION, BREAST
Anesthesia: General | Site: Breast | Laterality: Bilateral

## 2014-01-05 MED ORDER — FENTANYL CITRATE 0.05 MG/ML IJ SOLN
INTRAMUSCULAR | Status: DC | PRN
  Administered 2014-01-05: 50 ug via INTRAVENOUS
  Administered 2014-01-05: 25 ug via INTRAVENOUS
  Administered 2014-01-05: 100 ug via INTRAVENOUS
  Administered 2014-01-05: 25 ug via INTRAVENOUS

## 2014-01-05 MED ORDER — MIDAZOLAM HCL 2 MG/2ML IJ SOLN
INTRAMUSCULAR | Status: AC
  Filled 2014-01-05: qty 2

## 2014-01-05 MED ORDER — FENTANYL CITRATE 0.05 MG/ML IJ SOLN: 50.0000 ug | INTRAMUSCULAR | Status: DC | PRN

## 2014-01-05 MED ORDER — FENTANYL CITRATE 0.05 MG/ML IJ SOLN
INTRAMUSCULAR | Status: AC
  Filled 2014-01-05: qty 6

## 2014-01-05 MED ORDER — PROPOFOL 10 MG/ML IV BOLUS
INTRAVENOUS | Status: DC | PRN
  Administered 2014-01-05: 200 mg via INTRAVENOUS
  Administered 2014-01-05: 50 mg via INTRAVENOUS

## 2014-01-05 MED ORDER — MIDAZOLAM HCL 5 MG/5ML IJ SOLN
INTRAMUSCULAR | Status: DC | PRN
  Administered 2014-01-05: 2 mg via INTRAVENOUS

## 2014-01-05 MED ORDER — LIDOCAINE HCL (CARDIAC) 20 MG/ML IV SOLN
INTRAVENOUS | Status: DC | PRN
  Administered 2014-01-05: 80 mg via INTRAVENOUS

## 2014-01-05 MED ORDER — DEXAMETHASONE SODIUM PHOSPHATE 4 MG/ML IJ SOLN
INTRAMUSCULAR | Status: DC | PRN
  Administered 2014-01-05: 10 mg via INTRAVENOUS

## 2014-01-05 MED ORDER — OXYCODONE HCL 5 MG PO TABS: 5.0000 mg | ORAL_TABLET | Freq: Once | ORAL | Status: DC | PRN

## 2014-01-05 MED ORDER — ONDANSETRON HCL 4 MG/2ML IJ SOLN
INTRAMUSCULAR | Status: DC | PRN
  Administered 2014-01-05: 4 mg via INTRAVENOUS

## 2014-01-05 MED ORDER — MIDAZOLAM HCL 2 MG/2ML IJ SOLN: 1.0000 mg | INTRAMUSCULAR | Status: DC | PRN

## 2014-01-05 MED ORDER — DEXTROSE 5 % IV SOLN
3.0000 g | INTRAVENOUS | Status: AC
  Administered 2014-01-05: 3 g via INTRAVENOUS

## 2014-01-05 MED ORDER — LIDOCAINE-EPINEPHRINE 0.5 %-1:200000 IJ SOLN
INTRAMUSCULAR | Status: DC | PRN
  Administered 2014-01-05: 100 mL

## 2014-01-05 MED ORDER — SUCCINYLCHOLINE CHLORIDE 20 MG/ML IJ SOLN
INTRAMUSCULAR | Status: DC | PRN
  Administered 2014-01-05: 100 mg via INTRAVENOUS

## 2014-01-05 MED ORDER — SODIUM CHLORIDE 0.9 % IV SOLN
INTRAVENOUS | Status: DC | PRN
  Administered 2014-01-05: 12:00:00

## 2014-01-05 MED ORDER — HYDROMORPHONE HCL PF 1 MG/ML IJ SOLN: 0.2500 mg | INTRAMUSCULAR | Status: DC | PRN

## 2014-01-05 MED ORDER — CEFAZOLIN SODIUM 1-5 GM-% IV SOLN
INTRAVENOUS | Status: AC
  Filled 2014-01-05: qty 50

## 2014-01-05 MED ORDER — LACTATED RINGERS IV SOLN
INTRAVENOUS | Status: DC
  Administered 2014-01-05: 10:00:00 via INTRAVENOUS

## 2014-01-05 MED ORDER — LIDOCAINE-EPINEPHRINE 0.5 %-1:200000 IJ SOLN
INTRAMUSCULAR | Status: AC
  Filled 2014-01-05: qty 2

## 2014-01-05 MED ORDER — PROMETHAZINE HCL 25 MG/ML IJ SOLN: 6.2500 mg | INTRAMUSCULAR | Status: DC | PRN

## 2014-01-05 MED ORDER — MIDAZOLAM HCL 5 MG/5ML IJ SOLN: INTRAMUSCULAR | Status: DC | PRN

## 2014-01-05 MED ORDER — CEFAZOLIN SODIUM-DEXTROSE 2-3 GM-% IV SOLR
INTRAVENOUS | Status: AC
  Filled 2014-01-05: qty 50

## 2014-01-05 MED ORDER — OXYCODONE HCL 5 MG/5ML PO SOLN: 5.0000 mg | Freq: Once | ORAL | Status: DC | PRN

## 2014-01-05 SURGICAL SUPPLY — 68 items
APL SKNCLS STERI-STRIP NONHPOA (GAUZE/BANDAGES/DRESSINGS) ×1
BAG DECANTER FOR FLEXI CONT (MISCELLANEOUS) ×3 IMPLANT
BENZOIN TINCTURE PRP APPL 2/3 (GAUZE/BANDAGES/DRESSINGS) ×3 IMPLANT
BINDER BREAST 3XL (BIND) ×2 IMPLANT
BLADE KNIFE PERSONA 10 (BLADE) ×3 IMPLANT
BLADE KNIFE PERSONA 15 (BLADE) ×3 IMPLANT
CANISTER SUCT 1200ML W/VALVE (MISCELLANEOUS) ×3 IMPLANT
COVER MAYO STAND STRL (DRAPES) ×3 IMPLANT
COVER TABLE BACK 60X90 (DRAPES) ×3 IMPLANT
DECANTER SPIKE VIAL GLASS SM (MISCELLANEOUS) ×3 IMPLANT
DRAIN CHANNEL 10M FLAT 3/4 FLT (DRAIN) ×3 IMPLANT
DRAIN PENROSE 1/4X12 LTX STRL (WOUND CARE) IMPLANT
DRAPE LAPAROSCOPIC ABDOMINAL (DRAPES) ×3 IMPLANT
DRSG PAD ABDOMINAL 8X10 ST (GAUZE/BANDAGES/DRESSINGS) ×6 IMPLANT
ELECT BLADE 6.5 .24CM SHAFT (ELECTRODE) IMPLANT
ELECT REM PT RETURN 9FT ADLT (ELECTROSURGICAL) ×3
ELECTRODE REM PT RTRN 9FT ADLT (ELECTROSURGICAL) ×1 IMPLANT
EVACUATOR SILICONE 100CC (DRAIN) ×3 IMPLANT
FILTER 7/8 IN (FILTER) ×3 IMPLANT
GAUZE XEROFORM 5X9 LF (GAUZE/BANDAGES/DRESSINGS) ×3 IMPLANT
GLOVE BIO SURGEON STRL SZ 6.5 (GLOVE) ×2 IMPLANT
GLOVE BIO SURGEONS STRL SZ 6.5 (GLOVE) ×1
GLOVE BIOGEL M 7.0 STRL (GLOVE) ×2 IMPLANT
GLOVE BIOGEL M STRL SZ7.5 (GLOVE) ×1 IMPLANT
GLOVE BIOGEL PI IND STRL 7.5 (GLOVE) IMPLANT
GLOVE BIOGEL PI IND STRL 8 (GLOVE) ×1 IMPLANT
GLOVE BIOGEL PI INDICATOR 7.5 (GLOVE) ×2
GLOVE BIOGEL PI INDICATOR 8 (GLOVE)
GLOVE ECLIPSE 7.0 STRL STRAW (GLOVE) ×3 IMPLANT
GOWN STRL REUS W/ TWL LRG LVL3 (GOWN DISPOSABLE) IMPLANT
GOWN STRL REUS W/ TWL XL LVL3 (GOWN DISPOSABLE) ×1 IMPLANT
GOWN STRL REUS W/TWL LRG LVL3 (GOWN DISPOSABLE)
GOWN STRL REUS W/TWL XL LVL3 (GOWN DISPOSABLE) ×3
IV NS 500ML (IV SOLUTION) ×12
IV NS 500ML BAXH (IV SOLUTION) ×4 IMPLANT
KIT FILL SYSTEM UNIVERSAL (SET/KITS/TRAYS/PACK) ×3 IMPLANT
NDL HYPO 25X1 1.5 SAFETY (NEEDLE) IMPLANT
NDL SPNL 18GX3.5 QUINCKE PK (NEEDLE) IMPLANT
NEEDLE HYPO 25X1 1.5 SAFETY (NEEDLE) IMPLANT
NEEDLE SPNL 18GX3.5 QUINCKE PK (NEEDLE) IMPLANT
NS IRRIG 1000ML POUR BTL (IV SOLUTION) ×3 IMPLANT
PACK BASIN DAY SURGERY FS (CUSTOM PROCEDURE TRAY) ×3 IMPLANT
PEN SKIN MARKING BROAD TIP (MISCELLANEOUS) ×3 IMPLANT
PIN SAFETY STERILE (MISCELLANEOUS) IMPLANT
SLEEVE SCD COMPRESS KNEE MED (MISCELLANEOUS) ×1 IMPLANT
SPONGE GAUZE 4X4 12PLY (GAUZE/BANDAGES/DRESSINGS) ×3 IMPLANT
SPONGE LAP 18X18 X RAY DECT (DISPOSABLE) ×8 IMPLANT
STRIP SUTURE WOUND CLOSURE 1/2 (SUTURE) IMPLANT
SUT MNCRL AB 3-0 PS2 18 (SUTURE) ×3 IMPLANT
SUT MON AB 2-0 CT1 36 (SUTURE) ×3 IMPLANT
SUT MON AB 5-0 PS2 18 (SUTURE) IMPLANT
SUT PROLENE 3 0 PS 2 (SUTURE) IMPLANT
SUT PROLENE 5 0 PS 2 (SUTURE) ×2 IMPLANT
SYR 20CC LL (SYRINGE) IMPLANT
SYR 50ML LL SCALE MARK (SYRINGE) ×6 IMPLANT
SYR BULB IRRIGATION 50ML (SYRINGE) ×3 IMPLANT
SYR CONTROL 10ML LL (SYRINGE) ×3 IMPLANT
TAPE HYPAFIX 6 X30' (GAUZE/BANDAGES/DRESSINGS) ×1
TAPE HYPAFIX 6X30 (GAUZE/BANDAGES/DRESSINGS) ×2 IMPLANT
TAPE MEASURE 72IN RETRACT (INSTRUMENTS)
TAPE MEASURE LINEN 72IN RETRCT (INSTRUMENTS) IMPLANT
TOWEL OR 17X24 6PK STRL BLUE (TOWEL DISPOSABLE) ×12 IMPLANT
TRAY DSU PREP LF (CUSTOM PROCEDURE TRAY) ×3 IMPLANT
TUBE CONNECTING 20'X1/4 (TUBING) ×1
TUBE CONNECTING 20X1/4 (TUBING) ×2 IMPLANT
UNDERPAD 30X30 INCONTINENT (UNDERPADS AND DIAPERS) ×6 IMPLANT
VAC PENCILS W/TUBING CLEAR (MISCELLANEOUS) ×3 IMPLANT
YANKAUER SUCT BULB TIP NO VENT (SUCTIONS) ×3 IMPLANT

## 2014-01-05 NOTE — Anesthesia Procedure Notes (Signed)
Procedure Name: Intubation Performed by: Divina Neale W Pre-anesthesia Checklist: Patient identified, Timeout performed, Emergency Drugs available, Suction available and Patient being monitored Patient Re-evaluated:Patient Re-evaluated prior to inductionOxygen Delivery Method: Circle system utilized Preoxygenation: Pre-oxygenation with 100% oxygen Intubation Type: IV induction Ventilation: Mask ventilation without difficulty Laryngoscope Size: Miller and 2 Grade View: Grade I Tube type: Oral Tube size: 7.0 mm Number of attempts: 1 Airway Equipment and Method: Stylet Placement Confirmation: ETT inserted through vocal cords under direct vision,  breath sounds checked- equal and bilateral and positive ETCO2 Secured at: 22 cm Tube secured with: Tape Dental Injury: Teeth and Oropharynx as per pre-operative assessment      

## 2014-01-05 NOTE — Transfer of Care (Signed)
Immediate Anesthesia Transfer of Care Note  Patient: [name]  Procedure(s) Performed: Procedure(s): BILATERAL RECONSTRUCTION REVISION AND FLUIDS TO IMPLANTS AND REMOVAL IMPLANT PORTALS (Bilateral)  Patient Location: PACU  Anesthesia Type:General  Level of Consciousness: awake, alert  and oriented  Airway & Oxygen Therapy: Patient Spontanous Breathing and Patient connected to face mask oxygen  Post-op Assessment: Report given to PACU RN and Post -op Vital signs reviewed and stable  Post vital signs: Reviewed and stable  Complications: No apparent anesthesia complications

## 2014-01-05 NOTE — Anesthesia Postprocedure Evaluation (Signed)
  Anesthesia Post-op Note  Patient: [name]  Procedure(s) Performed: Procedure(s): BILATERAL RECONSTRUCTION REVISION AND FLUIDS TO IMPLANTS AND REMOVAL IMPLANT PORTALS (Bilateral)  Patient Location: PACU  Anesthesia Type:General  Level of Consciousness: awake and alert   Airway and Oxygen Therapy: Patient Spontanous Breathing  Post-op Pain: none  Post-op Assessment: Post-op Vital signs reviewed, Patient's Cardiovascular Status Stable and Respiratory Function Stable  Post-op Vital Signs: Reviewed  Filed Vitals:   01/05/14 1215  BP:   Pulse: 66  Temp:   Resp: 17    Complications: No apparent anesthesia complications

## 2014-01-05 NOTE — H&P (Signed)
[name] is an 53 y.o. female.   Chief Complaint: sp bilateral mastectomies for breast cancer HPI: Has ports still n and some deformity of the medial breast areas and wants more inflation of implants and port removals  Past Medical History  Diagnosis Date  . LAP-BAND surgery status   . Hypertension   . Morbid obesity   . Breast cancer 2008  . Arthritis   . Rheumatoid arthritis(714.0) 2013    Feet, joints, hands  . RA (rheumatoid arthritis) 2013  . GERD (gastroesophageal reflux disease)   . Allergy     Past Surgical History  Procedure Laterality Date  . Total knee arthroplasty Bilateral 12/2009; 02/2010  . Abdominal hysterectomy    . Laparoscopic gastric banding  07/2008  . Cesarean section  1979 and 1978  . Tympanic membrane repair Left 1983    Ear drum plate  . Breast reconstruction Bilateral 06/2010  . Mastectomy Bilateral 2008  . Tubal ligation      Family History  Problem Relation Age of Onset  . Cancer Mother 57    Breast cancer  . Cancer Father     Colon cancer  . Cancer Sister 45    Breast cancer  . Diabetes Sister   . Cancer Maternal Aunt     Breast cancer  . Cancer Paternal Aunt     Breast cancer  . Cancer Cousin     Breast cancer   Social History:  reports that she has never smoked. She has never used smokeless tobacco. She reports that she does not drink alcohol or use illicit drugs.  Allergies: No Known Allergies  Medications Prior to Admission  Medication Sig Dispense Refill  . hydroxychloroquine (PLAQUENIL) 200 MG tablet Take 200 mg by mouth daily.      . Calcium Carbonate-Vitamin D (CALCIUM 600 + D PO) Take 1 tablet by mouth daily.        . cetirizine (ZYRTEC) 10 MG tablet Take 10 mg by mouth daily.       . folic acid (FOLVITE) 1 MG tablet Take 1 mg by mouth daily.       . hydrochlorothiazide (HYDRODIURIL) 25 MG tablet Take 25 mg by mouth daily.        Marland Kitchen losartan (COZAAR) 50 MG tablet Take 50 mg by mouth daily.      . methocarbamol  (ROBAXIN) 500 MG tablet Take 500 mg by mouth as needed.      . methotrexate (RHEUMATREX) 2.5 MG tablet Take 25 mg by mouth once a week.       . Multiple Vitamins-Minerals (ONE-A-DAY WOMENS 50 PLUS PO) Take 1 tablet by mouth daily.       . Omeprazole (PRILOSEC PO) Take 20 mg by mouth as needed.         No results found for this or any previous visit (from the past 48 hour(s)). No results found.  Review of Systems  Constitutional: Negative.   HENT: Negative.   Eyes: Negative.   Respiratory: Negative.   Cardiovascular: Negative.   Gastrointestinal: Negative.   Genitourinary: Negative.   Musculoskeletal: Negative.   Skin: Negative.   Neurological: Negative.   Endo/Heme/Allergies: Negative.   Psychiatric/Behavioral: Negative.     Height 5\' 4"  (1.626 m), weight 133.811 kg (295 lb). Physical Exam   Assessment/Plan SP bilateral breast reconstructions for port removals and revision of both breasts and filling of each implant scar revisions  Cristine Polio 01/05/2014, 9:24 AM

## 2014-01-05 NOTE — Anesthesia Preprocedure Evaluation (Addendum)
Anesthesia Evaluation  Patient identified by MRN, date of birth, ID band Patient awake    Reviewed: Allergy & Precautions, H&P , NPO status , Patient's Chart, lab work & pertinent test results  History of Anesthesia Complications Negative for: history of anesthetic complications  Airway Mallampati: II  Neck ROM: Full    Dental  (+) Teeth Intact   Pulmonary neg pulmonary ROS,  breath sounds clear to auscultation        Cardiovascular hypertension, Rhythm:Regular Rate:Normal     Neuro/Psych negative neurological ROS     GI/Hepatic GERD-  ,  Endo/Other  Morbid obesity  Renal/GU      Musculoskeletal  (+) Arthritis -,   Abdominal (+) + obese,   Peds  Hematology  (+) anemia ,   Anesthesia Other Findings   Reproductive/Obstetrics                          Anesthesia Physical Anesthesia Plan  ASA: III  Anesthesia Plan: General   Post-op Pain Management:    Induction: Intravenous  Airway Management Planned: Oral ETT  Additional Equipment:   Intra-op Plan:   Post-operative Plan: Extubation in OR  Informed Consent: I have reviewed the patients History and Physical, chart, labs and discussed the procedure including the risks, benefits and alternatives for the proposed anesthesia with the patient or authorized representative who has indicated his/her understanding and acceptance.   Dental advisory given  Plan Discussed with: CRNA and Surgeon  Anesthesia Plan Comments:         Anesthesia Quick Evaluation

## 2014-01-05 NOTE — Brief Op Note (Signed)
01/05/2014  11:05 AM  PATIENT:  [name]  53 y.o. female  PRE-OPERATIVE DIAGNOSIS:  BREAST RECONSTRUCTION  POST-OPERATIVE DIAGNOSIS:  BREAST RECONSTRUCTION HISTORY OF BREAST CANCER  PROCEDURE:  Procedure(s): BILATERAL RECONSTRUCTION REVISION AND FLUIDS TO IMPLANTS AND REMOVAL IMPLANT PORTALS (Bilateral)  SURGEON:  Surgeon(s) and Role:    * Cristine Polio, MD - Primary  PHYSICIAN ASSISTANT:   ASSISTANTS: none   ANESTHESIA:   general  EBL:  Total I/O In: 1000 [I.V.:1000] Out: -   BLOOD ADMINISTERED:none  DRAINS: none   LOCAL MEDICATIONS USED:  LIDOCAINE   SPECIMEN:  Core Needle Biopsy  DISPOSITION OF SPECIMEN:  PATHOLOGY  COUNTS:  YES  TOURNIQUET:  * No tourniquets in log *  DICTATION: .Other Dictation: Dictation Number [id number]  PLAN OF CARE: Discharge to home after PACU  PATIENT DISPOSITION:  PACU - hemodynamically stable.   Delay start of Pharmacological VTE agent (>24hrs) due to surgical blood loss or risk of bleeding: not applicable

## 2014-01-05 NOTE — Discharge Instructions (Signed)
Activity (include date of return to work if known) °As tolerated: NO showers °NO driving °No heavy activities ° °Diet:regular No restrictions: ° °Wound Care: Keep dressing clean & dry ° °Do not change dressings °For Abdominoplasties wear abdominal binder °Special Instructions: °Do not raise arms up °Continue to empty, recharge, & record drainage from J-P drains &/or °Hemovacs 2-3 times a day, as needed. °Call Doctor if any unusual problems occur such as pain, excessive °Bleeding, unrelieved Nausea/vomiting, Fever &/or chills °When lying down, keep head elevated on 2-3 pillows or back-rest °For Addominoplasties the Jack-knife position °Follow-up appointment: Please call the office. ° °The patient received discharge instruction from:___________________________________________ ° ° °Patient signature ________________________________________ / Date___________ ° ° ° °Signature of individual providing instructions/ Date________________             ° ° ° °Post Anesthesia Home Care Instructions ° °Activity: °Get plenty of rest for the remainder of the day. A responsible adult should stay with you for 24 hours following the procedure.  °For the next 24 hours, DO NOT: °-Drive a car °-Operate machinery °-Drink alcoholic beverages °-Take any medication unless instructed by your physician °-Make any legal decisions or sign important papers. ° °Meals: °Start with liquid foods such as gelatin or soup. Progress to regular foods as tolerated. Avoid greasy, spicy, heavy foods. If nausea and/or vomiting occur, drink only clear liquids until the nausea and/or vomiting subsides. Call your physician if vomiting continues. ° °Special Instructions/Symptoms: °Your throat may feel dry or sore from the anesthesia or the breathing tube placed in your throat during surgery. If this causes discomfort, gargle with warm salt water. The discomfort should disappear within 24 hours. ° °

## 2014-01-06 NOTE — Op Note (Signed)
NAME:  [name], [name]               ACCOUNT NO.:  [id number]  MEDICAL RECORD NO.:  [id number]  LOCATION:                                 FACILITY:  PHYSICIAN:  Berneta Sages L. Eivin Mascio, M.D.DATE OF BIRTH:  Jul 16, 1961  DATE OF PROCEDURE:  01/05/2014 DATE OF DISCHARGE:  01/05/2014                              OPERATIVE REPORT   INDICATIONS:  This patient is status post bilateral mastectomies for breast cancer, has had tissue expansion, reconstructions with nipple- areolar reconstruction, still has excessive breast tissue in the lateral portions of the breast areas where the mastectomies were done and scar cicatrix of the area as well.  PROCEDURES:  Planned scar revision, excess scar removal, right and left axillary regions with plastic reconstruction of defects, all the areas measure approximately 8 inches in length and 4 inches in width.  She also has laxity of the tissue expander __________ more to another half cup size at least as well as more fat infiltration of the nipple areas for increased protrusion.  ANESTHESIA:  General.  DESCRIPTION OF PROCEDURE:  Preoperatively, the patient sat up and drawn for all the areas mentioned.  She underwent general anesthesia and intubated orally.  Prep was done to the chest, breast areas in routine fashion using Hibiclens soap and solution, walled off with sterile towels and drapes so as to make a sterile field.  A 0.5% Xylocaine with epinephrine was injected locally laterally to the breast areas __________ tissue, one-half percent, total up to 100 mL.  This was allowed to sit and then excision of that area was done first with the Bovie and cutting and coagulation, removing excess large amounts of tissue.  Hemostasis was maintained with the Bovie and coagulation. Next, dissection was carried out to the port of the tissue expander. Then, we put approximately 250 mL of saline sterile injectable into the right and left sides with __________ symmetry.   Next, the port was removed and dissected away from its capsule bilaterally.  Then, the tubing was then pulled and the tissue expander was sealed on itself. The patient has decided to continue to use the saline tissue expanders as implants.  If she changes her mind later on down the road, we can change to __________.  The same procedure was carried on both sides. Next, large defects were re-closed with 2-0 Monocryl deep subcutaneously x2 layers, a subdermal suture of 3-0 Monocryl and then a running subcuticular stitch of 3-0 Monocryl throughout the areas.  Next, a flap was made around each nipple area inferiorly from 3 to 9 o'clock and then dissected plane was done gently using blunt hemostat.  Then, I was __________ from the right side and then transplanted to the areas to give more protrusion to the area with good success on both sides.  The skin was then reapproximated with multiple sutures of 5-0 Prolene.  All the wounds were cleansed, good symmetry, and Steri-Strips soft dressing applied to all areas and a breast binder.  She tolerated the procedures very well and was taken to recovery in good condition.  ESTIMATED BLOOD LOSS:  Less than 100 mL.  COMPLICATIONS:  None.     Odella Aquas. Towanda Malkin, M.D.  ______________________________ Odella Aquas Towanda Malkin, M.D.    Elie Confer  D:  01/05/2014  T:  01/06/2014  Job:  [id number]

## 2014-01-12 ENCOUNTER — Encounter (HOSPITAL_BASED_OUTPATIENT_CLINIC_OR_DEPARTMENT_OTHER): Payer: Self-pay | Admitting: Specialist

## 2014-02-12 ENCOUNTER — Ambulatory Visit (HOSPITAL_BASED_OUTPATIENT_CLINIC_OR_DEPARTMENT_OTHER): Payer: Medicare PPO | Admitting: Oncology

## 2014-02-12 ENCOUNTER — Other Ambulatory Visit (HOSPITAL_BASED_OUTPATIENT_CLINIC_OR_DEPARTMENT_OTHER): Payer: Medicare PPO

## 2014-02-12 ENCOUNTER — Other Ambulatory Visit: Payer: Self-pay | Admitting: *Deleted

## 2014-02-12 VITALS — BP 131/87 | HR 66 | Temp 98.8°F | Resp 18 | Ht 64.0 in | Wt 307.5 lb

## 2014-02-12 DIAGNOSIS — M069 Rheumatoid arthritis, unspecified: Secondary | ICD-10-CM

## 2014-02-12 DIAGNOSIS — Z853 Personal history of malignant neoplasm of breast: Secondary | ICD-10-CM

## 2014-02-12 DIAGNOSIS — C50919 Malignant neoplasm of unspecified site of unspecified female breast: Secondary | ICD-10-CM

## 2014-02-12 DIAGNOSIS — Z9884 Bariatric surgery status: Secondary | ICD-10-CM

## 2014-02-12 LAB — COMPREHENSIVE METABOLIC PANEL (CC13)
ALBUMIN: 3.8 g/dL (ref 3.5–5.0)
ALT: 15 U/L (ref 0–55)
AST: 14 U/L (ref 5–34)
Alkaline Phosphatase: 91 U/L (ref 40–150)
Anion Gap: 10 mEq/L (ref 3–11)
BUN: 12.6 mg/dL (ref 7.0–26.0)
CALCIUM: 10.1 mg/dL (ref 8.4–10.4)
CHLORIDE: 103 meq/L (ref 98–109)
CO2: 28 mEq/L (ref 22–29)
Creatinine: 1 mg/dL (ref 0.6–1.1)
GLUCOSE: 104 mg/dL (ref 70–140)
POTASSIUM: 3.6 meq/L (ref 3.5–5.1)
Sodium: 141 mEq/L (ref 136–145)
Total Bilirubin: 0.31 mg/dL (ref 0.20–1.20)
Total Protein: 7.4 g/dL (ref 6.4–8.3)

## 2014-02-12 LAB — CBC WITH DIFFERENTIAL/PLATELET
BASO%: 0.5 % (ref 0.0–2.0)
BASOS ABS: 0 10*3/uL (ref 0.0–0.1)
EOS ABS: 0.1 10*3/uL (ref 0.0–0.5)
EOS%: 1.1 % (ref 0.0–7.0)
HCT: 35 % (ref 34.8–46.6)
HGB: 11.3 g/dL — ABNORMAL LOW (ref 11.6–15.9)
LYMPH#: 1.7 10*3/uL (ref 0.9–3.3)
LYMPH%: 20.5 % (ref 14.0–49.7)
MCH: 28.5 pg (ref 25.1–34.0)
MCHC: 32.4 g/dL (ref 31.5–36.0)
MCV: 88.1 fL (ref 79.5–101.0)
MONO#: 0.5 10*3/uL (ref 0.1–0.9)
MONO%: 5.9 % (ref 0.0–14.0)
NEUT%: 72 % (ref 38.4–76.8)
NEUTROS ABS: 6 10*3/uL (ref 1.5–6.5)
Platelets: 229 10*3/uL (ref 145–400)
RBC: 3.97 10*6/uL (ref 3.70–5.45)
RDW: 16.3 % — AB (ref 11.2–14.5)
WBC: 8.3 10*3/uL (ref 3.9–10.3)

## 2014-02-12 NOTE — Progress Notes (Signed)
[facility]  Telephone:(336) [phone] Fax:(336) [phone]  OFFICE PROGRESS NOTE   ID: RAYMONDA PELL   DOB: [birth year]  MR#: [id number]  JYN#:[id number]   PCP: Cari Caraway, MD GYN: Tana Conch. Mezer, M.D. SU: Fanny Skates, M.D./David H. Lucia Gaskins, M.D./Gerald Towanda Malkin, M.D. OTHER MD:   HISTORY OF PRESENT ILLNESS: From Dr. Collier Salina Rubin's the patient evaluation note dated 11/07/2006: "This is a 53 year old woman who is referred by Dr. Dalbert Batman for evaluation and treatment of breast cancer.  This woman has a strong family history of breast cancer and noted a mass in her left breast about six weeks ago.  She has had previous mammograms on an annual basis.  She denies any nipple retractions, skin changes or nipple discharge.  A mammogram was performed on 10/26/2006, ultrasound was performed as well.  This essentially showed a 3.4 cm mass upper outer right quadrant of the left breast.  On physical examination a firm nodule was palpable at 11 o'clock position 14 cm from the nipple.  Dimensions of this were 3.0 x 1.4 x 2.3 cm.  No large axillary lymph nodes were identified.  A biopsy was recommended and performed the same day.  This essentially showed high-grade invasive ductal cancer, nuclear grade 3 of 3 with no significant tubular formation.  Prognostic panel showed this to be ER/PR negative, HER-2 is 2+, FISH is pending, proliferative index 49%.  The patient has been referred for neoadjuvant chemotherapy."  Her subsequent history is as detailed below  INTERVAL HISTORY: Beonka returns today for followup of her breast cancer. Interval history is generally unremarkable except she tells me she has been diagnosed with rheumatoid arthritis and she has been having some "flares". Her main source of exercise at this point is water aerobics, which she does at [place]: She has insomnia problems which are long-standing. She has pain chiefly in her feet. Her knees also her. She  has seen Adriana Mccallum about this in the past. She complains of an irregular heart rate, which is not a new problem, but no significant shortness of breath. She is able to get up a flight of stairs without stopping. She sleeps on 2 pillows. The back and joint pain she has is not more insistence or intense than before. She has rare headaches. A detailed review of systems today was otherwise noncontributory   PAST MEDICAL HISTORY: Past Medical History  Diagnosis Date  . LAP-BAND surgery status   . Hypertension   . Morbid obesity   . Breast cancer 2008  . Arthritis   . Rheumatoid arthritis(714.0) 2013    Feet, joints, hands  . RA (rheumatoid arthritis) 2013  . GERD (gastroesophageal reflux disease)   . Allergy   Has a history of arthritis involving her knees.  She has had previous arthroscopic type intervention.  She has had recent injection of her right knee.    PAST SURGICAL HISTORY: Past Surgical History  Procedure Laterality Date  . Total knee arthroplasty Bilateral 12/2009; 02/2010  . Abdominal hysterectomy    . Laparoscopic gastric banding  07/2008  . Cesarean section  1979 and 1978  . Tympanic membrane repair Left 1983    Ear drum plate  . Breast reconstruction Bilateral 06/2010  . Mastectomy Bilateral 2008  . Tubal ligation    . Breast reconstruction Bilateral 01/05/2014    Procedure: BILATERAL RECONSTRUCTION REVISION AND FLUIDS TO IMPLANTS AND REMOVAL IMPLANT PORTALS;  Surgeon: Cristine Polio, MD;  Location: [facility];  Service:  Plastics;  Laterality: Bilateral;  Includes two caesarian sections, arthroscopic surgery of her knee and an intervention for a perforated eardrum.   FAMILY HISTORY Family History  Problem Relation Age of Onset  . Cancer Mother 71    Breast cancer  . Cancer Father     Colon cancer  . Cancer Sister 73    Breast cancer  . Diabetes Sister   . Cancer Maternal Aunt     Breast cancer  . Cancer Paternal Aunt     Breast cancer  .  Cancer Cousin     Breast cancer  Father died of colon cancer.  He had four sisters.  Mother had breast cancer in the 44s and died of cancer in her late 28s.  Unclear whether she had got a gynecologic malignancy as well.  She had three sisters and five brothers.  The patient has a sister who was diagnosed with breast cancer at age 55, currently living in [place], she has two maternal aunts with breast cancer, a paternal aunt with breast cancer.  She also has a maternal cousin who has bilateral breast cancer, and first cancer at age 27.  She apparently has had the genetic testing as positive for the gene.  GYNECOLOGIC HISTORY: She is gravida 2, para 2, menarche unknown age.  Four years ago she was seen by Dr. Carren Rang.  She apparently has a history of menorrhagia and previously had a biopsy of what sounds like an endometrial polyp.  SOCIAL HISTORY: Ms. Copelin has been divorced since 2007.  Previously married twice, her first marriage was for five years, her second marriage was for 15 years.  She has two children, an adult son that lives in [place] and a teenage daughter.  The patient is a retired [employer], school bus driver.  In her spare time she enjoys participating in water aerobics, cooking, going to church, and spending time with her family.   ADVANCED DIRECTIVES: Not on file  HEALTH MAINTENANCE: History  Substance Use Topics  . Smoking status: Never Smoker   . Smokeless tobacco: Never Used  . Alcohol Use: No    Colonoscopy: Due 2015. PAP: Status post hysterectomy Bone density: The patient's last bone density examination on 12/07/2010 showed a T score of -0.9 (normal). Lipid panel:  No Known Allergies  Current Outpatient Prescriptions  Medication Sig Dispense Refill  . Calcium Carbonate-Vitamin D (CALCIUM 600 + D PO) Take 1 tablet by mouth daily.        . cetirizine (ZYRTEC) 10 MG tablet Take 10 mg by mouth daily.       . folic acid (FOLVITE) 1 MG tablet Take 1 mg  by mouth daily.       . hydrochlorothiazide (HYDRODIURIL) 25 MG tablet Take 25 mg by mouth daily.        . hydroxychloroquine (PLAQUENIL) 200 MG tablet Take 200 mg by mouth daily.      Marland Kitchen losartan (COZAAR) 50 MG tablet Take 50 mg by mouth daily.      . methocarbamol (ROBAXIN) 500 MG tablet Take 500 mg by mouth as needed.      . methotrexate (RHEUMATREX) 2.5 MG tablet Take 25 mg by mouth once a week.       . Omeprazole (PRILOSEC PO) Take 20 mg by mouth as needed.        No current facility-administered medications for this visit.    OBJECTIVE: Middle-aged [place] American woman who appears stated age 89 Vitals:   02/12/14  1445  BP: 131/87  Pulse: 66  Temp: 98.8 F (37.1 C)  Resp: 18     Body mass index is 52.76 kg/(m^2).      ECOG FS: 1 - Symptomatic but completely ambulatory  Sclerae unicteric, EOMs intact Oropharynx clear and moist No cervical or supraclavicular adenopathy Lungs no rales or rhonchi Heart regular rate and rhythm Abd soft, obese, nontender, positive bowel sounds MSK no focal spinal tenderness, no upper extremity lymphedema Neuro: nonfocal, well oriented, pleasant affect Breasts: Status post bilateral mastectomies with reconstruction. There is no evidence of local recurrence. Both axillae are benign  LAB RESULTS: Lab Results  Component Value Date   WBC 8.3 02/12/2014   NEUTROABS 6.0 02/12/2014   HGB 11.3* 02/12/2014   HCT 35.0 02/12/2014   MCV 88.1 02/12/2014   PLT 229 02/12/2014      Chemistry      Component Value Date/Time   NA 142 02/12/2013 1559   NA 139 12/07/2011 1010   K 3.5 02/12/2013 1559   K 3.9 12/07/2011 1010   CL 103 02/12/2013 1559   CL 101 12/07/2011 1010   CO2 30* 02/12/2013 1559   CO2 27 12/07/2011 1010   BUN 13.2 02/12/2013 1559   BUN 14 12/07/2011 1010   CREATININE 0.8 02/12/2013 1559   CREATININE 0.76 12/07/2011 1010      Component Value Date/Time   CALCIUM 10.2 02/12/2013 1559   CALCIUM 9.7 12/07/2011 1010   ALKPHOS 93 02/12/2013 1559    ALKPHOS 89 12/07/2011 1010   AST 17 02/12/2013 1559   AST 15 12/07/2011 1010   ALT 19 02/12/2013 1559   ALT 21 12/07/2011 1010   BILITOT 0.22 02/12/2013 1559   BILITOT 0.2* 12/07/2011 1010       Lab Results  Component Value Date   LABCA2 22 12/07/2011    Urinalysis    Component Value Date/Time   COLORURINE YELLOW 03/01/2009 0800   APPEARANCEUR CLOUDY* 03/01/2009 0800   LABSPEC 1.025 03/01/2009 0800   LABSPEC 1.020 08/02/2007 1322   PHURINE 7.0 03/01/2009 0800   GLUCOSEU NEGATIVE 03/01/2009 0800   HGBUR NEGATIVE 03/01/2009 0800   BILIRUBINUR NEGATIVE 03/01/2009 0800   KETONESUR NEGATIVE 03/01/2009 0800   PROTEINUR NEGATIVE 03/01/2009 0800   UROBILINOGEN 0.2 03/01/2009 0800   NITRITE NEGATIVE 03/01/2009 0800   LEUKOCYTESUR NEGATIVE MICROSCOPIC NOT DONE ON URINES WITH NEGATIVE PROTEIN, BLOOD, LEUKOCYTES, NITRITE, OR GLUCOSE <1000 mg/dL. 03/01/2009 0800    STUDIES: No results found.  ASSESSMENT: 53 y.o. [place] woman: 1.  Status post left breast needle core biopsy at 11 o'clock position on 10/26/2006 which showed invasive mammary carcinoma, PR 0%, PR 0%, Ki-67 49%, HER-2/neu 2+.  2.  Status post bilateral breast MRI on 11/09/2006 which showed no suspicious masses or enhancements were identified in the right breast.  No right axillary adenopathy.  No radiographic evidence of malignancy in the right.  The left breast had an irregular, enhancing mass within the deep superior portion of the breast which measured 3.2 x 2.0 x 2.2 cm.  There was also a separate 1.3 cm enhancing area within the deep inferior central left breast.  No left axillary adenopathy was seen.  Scattered lymph nodes within the upper outer quadrant were stable compared with prior mammogram.  3.  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  4.  Status post left modified  radical  mastectomy on 12/04/2006 which showed invasive ductal carcinoma, 3.3 cm, grade 3, lymphovascular space invasion was present, no evidence of Paget's disease of the nipple, fibroadenoma 1.2 cm in the central inferior area of the breast, fibrocystic changes including fibrosis and ductal ectasia, left sentinel node biopsy showing one lymph node negative for tumor and one lymph node positive for metastatic ductal carcinoma, stage IIB, pT2, pN1a, pMX, ER 0%, PR 0%, Ki-67 49%, HER-2/neu 2+/ HER-2/neu by FISH amplification with a ratio of 2.05, 1/19 metastatic lymph nodes.  status post right breast mastectomy on 12/04/2006 which showed invasive ductal carcinoma, 0.6 cm, grade 3, fibrocystic changes including fibrosis, ductal ectasia and sclerotic lobules with rare calcification, no evidence of Paget's disease of the nipple, right axillary sentinel node resection, florid lymphoid hyperplasia, stage IIA, pT1b, pN1a, pMX.  ER 0%, PR 0%, Ki-67 50%, HER-2/neu negative by FISH no amplification, with 1/5 metastatic carcinoma lymph nodes identified and 1 micrometastatic lymph node identified.  5.  Status post adjuvant therapy with Herceptin that started on 02/06/2007 through 03/04/2008.  Concurrently the patient received adjuvant chemotherapy with Taxotere/Carboplatin starting on 05/10/2007 through 08/23/2007 x 6 cycles.  The patient also received Procrit during this time on 08/02/2007, 08/09/2007 and 08/23/2007. Echocardiogram on 05/14/2007 showed a left ventricular ejection fraction estimated between 55% to 60%.  6. Status post total abdominal hysterectomy with bilateral salpingo-oophorectomy and lysis of adhesions on 03/11/2007.    7.  Status post right axilla regional resection on 03/11/2007 in which 14 lymph nodes were examined with no evidence of metastatic carcinoma.  8.  Status post lap band surgery on 08/04/2008.  9.  Status post bilateral breast reconstructive surgery in 06/2010.  PLAN: Lynnae is doing  well from a breast cancer point of view, and it is very gratifying that she is more than 7 years status post her definitive surgery with no evidence of disease recurrence.  At this point I feel comfortable releasing her to her primary care physician, Dr. Leonides Schanz. All Aiyanna will need in terms of screening for recurrent breast cancer as a year or physician chest wall exam.  Aubriauna was pleased to be able to "graduate" today. She knows that we'll be glad to see her at any point in the future if and when the need arises, but as of now we are making no further routine appointment for her here.    Chauncey Cruel, MD  02/12/2014, 2:52 PM

## 2014-02-17 ENCOUNTER — Telehealth: Payer: Self-pay | Admitting: Oncology

## 2014-02-17 NOTE — Telephone Encounter (Signed)
Sent letter to patient from Dr. Magrinat. °

## 2015-02-09 ENCOUNTER — Other Ambulatory Visit: Payer: Self-pay | Admitting: Gastroenterology

## 2015-04-01 DIAGNOSIS — M0579 Rheumatoid arthritis with rheumatoid factor of multiple sites without organ or systems involvement: Secondary | ICD-10-CM | POA: Diagnosis not present

## 2015-04-01 DIAGNOSIS — R7309 Other abnormal glucose: Secondary | ICD-10-CM | POA: Diagnosis not present

## 2015-04-01 DIAGNOSIS — E559 Vitamin D deficiency, unspecified: Secondary | ICD-10-CM | POA: Diagnosis not present

## 2015-04-01 DIAGNOSIS — I10 Essential (primary) hypertension: Secondary | ICD-10-CM | POA: Diagnosis not present

## 2015-04-01 DIAGNOSIS — K219 Gastro-esophageal reflux disease without esophagitis: Secondary | ICD-10-CM | POA: Diagnosis not present

## 2015-04-01 DIAGNOSIS — Z0000 Encounter for general adult medical examination without abnormal findings: Secondary | ICD-10-CM | POA: Diagnosis not present

## 2015-04-01 DIAGNOSIS — D509 Iron deficiency anemia, unspecified: Secondary | ICD-10-CM | POA: Diagnosis not present

## 2015-04-01 DIAGNOSIS — G4733 Obstructive sleep apnea (adult) (pediatric): Secondary | ICD-10-CM | POA: Diagnosis not present

## 2015-04-14 DIAGNOSIS — M2550 Pain in unspecified joint: Secondary | ICD-10-CM | POA: Diagnosis not present

## 2015-04-14 DIAGNOSIS — M0570 Rheumatoid arthritis with rheumatoid factor of unspecified site without organ or systems involvement: Secondary | ICD-10-CM | POA: Diagnosis not present

## 2015-04-14 DIAGNOSIS — Z79899 Other long term (current) drug therapy: Secondary | ICD-10-CM | POA: Diagnosis not present

## 2015-06-28 DIAGNOSIS — C50912 Malignant neoplasm of unspecified site of left female breast: Secondary | ICD-10-CM | POA: Diagnosis not present

## 2015-06-28 DIAGNOSIS — C50911 Malignant neoplasm of unspecified site of right female breast: Secondary | ICD-10-CM | POA: Diagnosis not present

## 2015-07-27 DIAGNOSIS — Z79899 Other long term (current) drug therapy: Secondary | ICD-10-CM | POA: Diagnosis not present

## 2015-07-27 DIAGNOSIS — M150 Primary generalized (osteo)arthritis: Secondary | ICD-10-CM | POA: Diagnosis not present

## 2015-07-27 DIAGNOSIS — M2550 Pain in unspecified joint: Secondary | ICD-10-CM | POA: Diagnosis not present

## 2015-07-27 DIAGNOSIS — M79671 Pain in right foot: Secondary | ICD-10-CM | POA: Diagnosis not present

## 2015-07-27 DIAGNOSIS — M0579 Rheumatoid arthritis with rheumatoid factor of multiple sites without organ or systems involvement: Secondary | ICD-10-CM | POA: Diagnosis not present

## 2015-09-26 ENCOUNTER — Emergency Department (HOSPITAL_BASED_OUTPATIENT_CLINIC_OR_DEPARTMENT_OTHER)
Admission: EM | Admit: 2015-09-26 | Discharge: 2015-09-26 | Disposition: A | Discharge status: Home / Self Care | Payer: Medicare Other | Attending: Emergency Medicine | Admitting: Emergency Medicine

## 2015-09-26 ENCOUNTER — Emergency Department (HOSPITAL_BASED_OUTPATIENT_CLINIC_OR_DEPARTMENT_OTHER): Payer: Medicare Other

## 2015-09-26 ENCOUNTER — Encounter (HOSPITAL_BASED_OUTPATIENT_CLINIC_OR_DEPARTMENT_OTHER): Payer: Self-pay | Admitting: Emergency Medicine

## 2015-09-26 DIAGNOSIS — K219 Gastro-esophageal reflux disease without esophagitis: Secondary | ICD-10-CM | POA: Insufficient documentation

## 2015-09-26 DIAGNOSIS — Z853 Personal history of malignant neoplasm of breast: Secondary | ICD-10-CM | POA: Insufficient documentation

## 2015-09-26 DIAGNOSIS — R1032 Left lower quadrant pain: Secondary | ICD-10-CM | POA: Diagnosis not present

## 2015-09-26 DIAGNOSIS — Z90710 Acquired absence of both cervix and uterus: Secondary | ICD-10-CM | POA: Diagnosis not present

## 2015-09-26 DIAGNOSIS — Z9851 Tubal ligation status: Secondary | ICD-10-CM | POA: Insufficient documentation

## 2015-09-26 DIAGNOSIS — R1030 Lower abdominal pain, unspecified: Secondary | ICD-10-CM

## 2015-09-26 DIAGNOSIS — I10 Essential (primary) hypertension: Secondary | ICD-10-CM | POA: Insufficient documentation

## 2015-09-26 DIAGNOSIS — Z9884 Bariatric surgery status: Secondary | ICD-10-CM | POA: Diagnosis not present

## 2015-09-26 DIAGNOSIS — M069 Rheumatoid arthritis, unspecified: Secondary | ICD-10-CM | POA: Diagnosis not present

## 2015-09-26 DIAGNOSIS — Z79899 Other long term (current) drug therapy: Secondary | ICD-10-CM | POA: Diagnosis not present

## 2015-09-26 DIAGNOSIS — R1031 Right lower quadrant pain: Secondary | ICD-10-CM | POA: Insufficient documentation

## 2015-09-26 LAB — BASIC METABOLIC PANEL
Anion gap: 5 (ref 5–15)
BUN: 15 mg/dL (ref 6–20)
CALCIUM: 10.1 mg/dL (ref 8.9–10.3)
CHLORIDE: 103 mmol/L (ref 101–111)
CO2: 30 mmol/L (ref 22–32)
CREATININE: 0.79 mg/dL (ref 0.44–1.00)
Glucose, Bld: 108 mg/dL — ABNORMAL HIGH (ref 65–99)
Potassium: 3.9 mmol/L (ref 3.5–5.1)
SODIUM: 138 mmol/L (ref 135–145)

## 2015-09-26 LAB — URINALYSIS, ROUTINE W REFLEX MICROSCOPIC
BILIRUBIN URINE: NEGATIVE
Glucose, UA: NEGATIVE mg/dL
HGB URINE DIPSTICK: NEGATIVE
Ketones, ur: NEGATIVE mg/dL
Leukocytes, UA: NEGATIVE
Nitrite: NEGATIVE
PROTEIN: NEGATIVE mg/dL
Specific Gravity, Urine: 1.01 (ref 1.005–1.030)
pH: 6 (ref 5.0–8.0)

## 2015-09-26 LAB — CBC WITH DIFFERENTIAL/PLATELET
BASOS ABS: 0 10*3/uL (ref 0.0–0.1)
BASOS PCT: 0 %
EOS ABS: 0.1 10*3/uL (ref 0.0–0.7)
EOS PCT: 1 %
HCT: 35.1 % — ABNORMAL LOW (ref 36.0–46.0)
HEMOGLOBIN: 10.8 g/dL — AB (ref 12.0–15.0)
LYMPHS ABS: 2.1 10*3/uL (ref 0.7–4.0)
Lymphocytes Relative: 33 %
MCH: 28.3 pg (ref 26.0–34.0)
MCHC: 30.8 g/dL (ref 30.0–36.0)
MCV: 92.1 fL (ref 78.0–100.0)
Monocytes Absolute: 0.6 10*3/uL (ref 0.1–1.0)
Monocytes Relative: 9 %
Neutro Abs: 3.6 10*3/uL (ref 1.7–7.7)
Neutrophils Relative %: 57 %
PLATELETS: 216 10*3/uL (ref 150–400)
RBC: 3.81 MIL/uL — ABNORMAL LOW (ref 3.87–5.11)
RDW: 15.9 % — ABNORMAL HIGH (ref 11.5–15.5)
WBC: 6.4 10*3/uL (ref 4.0–10.5)

## 2015-09-26 MED ORDER — IOHEXOL 300 MG/ML  SOLN
25.0000 mL | Freq: Once | INTRAMUSCULAR | Status: AC | PRN
  Administered 2015-09-26: 25 mL via ORAL

## 2015-09-26 MED ORDER — ACETAMINOPHEN 325 MG PO TABS
650.0000 mg | ORAL_TABLET | Freq: Once | ORAL | Status: AC
  Administered 2015-09-26: 650 mg via ORAL
  Filled 2015-09-26: qty 2

## 2015-09-26 MED ORDER — IOHEXOL 300 MG/ML  SOLN
100.0000 mL | Freq: Once | INTRAMUSCULAR | Status: AC | PRN
  Administered 2015-09-26: 100 mL via INTRAVENOUS

## 2015-09-26 NOTE — ED Notes (Signed)
Pt states has bilateral groin pain, pain increases with standing and walking, changing position can often find a position of comfort. States pain radiates to vaginal area as well. States is able to eat and having normal BMs

## 2015-09-26 NOTE — ED Provider Notes (Signed)
CSN: [id number]     Arrival date & time 09/26/15  1139 History   First MD Initiated Contact with Patient 09/26/15 1156     Chief Complaint  Patient presents with  . Abdominal Pain     (Consider location/radiation/quality/duration/timing/severity/associated sxs/prior Treatment) HPI  55 year old female presents with lower abdominal/pelvic pain. Patient has a long-standing history of pain in this area that comes and goes. However she states today was more severe. Started this morning when bending over before church. Yesterday had diffuse abdominal pain that she thinks was related to gas but today has been having significant pain in her groin. Radiates towards her vagina. No vaginal bleeding or discharge. No dysuria. No constipation or diarrhea. No nausea or vomiting.  Past Medical History  Diagnosis Date  . LAP-BAND surgery status   . Hypertension   . Morbid obesity ([place])   . Breast cancer ([place]) 2008  . Arthritis   . Rheumatoid arthritis(714.0) 2013    Feet, joints, hands  . RA (rheumatoid arthritis) ([place]) 2013  . GERD (gastroesophageal reflux disease)   . Allergy    Past Surgical History  Procedure Laterality Date  . Total knee arthroplasty Bilateral 12/2009; 02/2010  . Abdominal hysterectomy    . Laparoscopic gastric banding  07/2008  . Cesarean section  1979 and 1978  . Tympanic membrane repair Left 1983    Ear drum plate  . Breast reconstruction Bilateral 06/2010  . Mastectomy Bilateral 2008  . Tubal ligation    . Breast reconstruction Bilateral 01/05/2014    Procedure: BILATERAL RECONSTRUCTION REVISION AND FLUIDS TO IMPLANTS AND REMOVAL IMPLANT PORTALS;  Surgeon: Cristine Polio, MD;  Location: [facility];  Service: Plastics;  Laterality: Bilateral;   Family History  Problem Relation Age of Onset  . Cancer Mother 46    Breast cancer  . Cancer Father     Colon cancer  . Cancer Sister 62    Breast cancer  . Diabetes Sister   . Cancer Maternal Aunt      Breast cancer  . Cancer Paternal Aunt     Breast cancer  . Cancer Cousin     Breast cancer   Social History  Substance Use Topics  . Smoking status: Never Smoker   . Smokeless tobacco: Never Used  . Alcohol Use: No   OB History    No data available     Review of Systems  Constitutional: Negative for fever.  Gastrointestinal: Positive for abdominal pain. Negative for nausea and vomiting.  Genitourinary: Negative for dysuria, vaginal bleeding and vaginal discharge.  All other systems reviewed and are negative.     Allergies  Review of patient's allergies indicates no known allergies.  Home Medications   Prior to Admission medications   Medication Sig Start Date End Date Taking? Authorizing Provider  Calcium Carbonate-Vitamin D (CALCIUM 600 + D PO) Take 1 tablet by mouth daily.     Yes Historical Provider, MD  cetirizine (ZYRTEC) 10 MG tablet Take 10 mg by mouth daily.    Yes Historical Provider, MD  hydrochlorothiazide (HYDRODIURIL) 25 MG tablet Take 25 mg by mouth daily.     Yes Historical Provider, MD  hydroxychloroquine (PLAQUENIL) 200 MG tablet Take 200 mg by mouth daily.   Yes Historical Provider, MD  losartan (COZAAR) 50 MG tablet Take 50 mg by mouth daily.   Yes Historical Provider, MD  metFORMIN (GLUCOPHAGE) 1000 MG tablet Take 1,000 mg by mouth 2 (two) times daily with a meal.  Yes Historical Provider, MD  methotrexate (RHEUMATREX) 2.5 MG tablet Take 25 mg by mouth once a week.  10/02/12  Yes Historical Provider, MD  Omeprazole (PRILOSEC PO) Take 20 mg by mouth as needed.    Yes Historical Provider, MD  folic acid (FOLVITE) 1 MG tablet Take 1 mg by mouth daily.  10/27/12   Historical Provider, MD   BP 165/78 mmHg  Pulse 78  Temp(Src) 98 F (36.7 C) (Oral)  Resp 20  Ht 5\' 2"  (1.575 m)  Wt 307 lb (139.254 kg)  BMI 56.14 kg/m2  SpO2 100% Physical Exam  Constitutional: She is oriented to person, place, and time. She appears well-developed and well-nourished.   Morbidly obese  HENT:  Head: Normocephalic and atraumatic.  Right Ear: External ear normal.  Left Ear: External ear normal.  Nose: Nose normal.  Eyes: Right eye exhibits no discharge. Left eye exhibits no discharge.  Cardiovascular: Normal rate, regular rhythm and normal heart sounds.   Pulmonary/Chest: Effort normal and breath sounds normal.  Abdominal: Soft. There is tenderness in the right lower quadrant, suprapubic area and left lower quadrant.  Neurological: She is alert and oriented to person, place, and time.  Skin: Skin is warm and dry.  Nursing note and vitals reviewed.   ED Course  Procedures (including critical care time) Labs Review Labs Reviewed  BASIC METABOLIC PANEL - Abnormal; Notable for the following:    Glucose, Bld 108 (*)    All other components within normal limits  CBC WITH DIFFERENTIAL/PLATELET - Abnormal; Notable for the following:    RBC 3.81 (*)    Hemoglobin 10.8 (*)    HCT 35.1 (*)    RDW 15.9 (*)    All other components within normal limits  URINALYSIS, ROUTINE W REFLEX MICROSCOPIC (NOT AT [facility])    Imaging Review Ct Abdomen Pelvis W Contrast  09/26/2015  CLINICAL DATA:  Patient with history of acute onset lower abdominal pain while sitting in church. No nausea, vomiting or diarrhea. History lap band procedure. EXAM: CT ABDOMEN AND PELVIS WITH CONTRAST TECHNIQUE: Multidetector CT imaging of the abdomen and pelvis was performed using the standard protocol following bolus administration of intravenous contrast. CONTRAST:  116mL OMNIPAQUE IOHEXOL 300 MG/ML SOLN, 55mL OMNIPAQUE IOHEXOL 300 MG/ML SOLN COMPARISON:  Abdominal radiograph 08/05/2008 FINDINGS: Lower chest: Minimal dependent atelectasis. Heart is enlarged. Small hiatal hernia. Distal esophageal wall thickening. Hepatobiliary: Liver is normal in size and contour. Too small to characterize sub cm low-attenuation lesion within the left hepatic lobe and central liver (image 22; series 3) and (image 8;  series 3). Gallbladder is decompressed. No intrahepatic or extrahepatic biliary ductal dilatation. Pancreas: Unremarkable Spleen: Unremarkable Adrenals/Urinary Tract: Adrenal glands are normal. Kidneys enhance symmetrically with contrast. No hydronephrosis. Stomach/Bowel: No evidence for bowel obstruction. Normal morphology to the stomach. Trace fluid in the pelvis. No free intraperitoneal air. Vascular/Lymphatic: Normal caliber abdominal aorta. No retroperitoneal lymphadenopathy. Other: Gastric band is present. Port is demonstrated within the subcutaneous fat of the right aspect of the anterior abdominal wall (image 40; series 3). The tubing from the port to the gastric band has fractured. Tubing courses into the left aspect of the abdomen and terminates within the pelvis (image 79; series 3). This is not contiguous with gastric band and distal aspect of the tubing. Musculoskeletal: Lumbar spine degenerative changes. No aggressive or acute appearing osseous lesions. IMPRESSION: Patient has gastric band in place. The catheter tubing has become disconnected within the mid aspect with the proximal portion of  the tubing terminating within the pelvis. The gastric band appears in place around the stomach with a small amount of distal tubing still attached. Wall thickening of the distal esophagus, potentially secondary to esophagitis. Consider clinical correlation and possible direct visualization as clinically indicated. These results were called by telephone at the time of interpretation on 09/26/2015 at 2:30 pm toStevie Barrett, PA , who verbally acknowledged these results. Electronically Signed   By: Lovey Newcomer M.D.   On: 09/26/2015 14:37   I have personally reviewed and evaluated these images and lab results as part of my medical decision-making.   EKG Interpretation None      MDM   Final diagnoses:  Lower abdominal pain  LAP-BAND surgery status    Patient's CT scan shows that she has a gastric band in  place and the catheter tubing has become disconnected and now is resting in her pelvis. This could be contributing to her pain. No evidence of perforation or other significant acute abnormality. Her pain is controlled in the ED. I discussed with surgery on call, Dr. Redmond Pulling, who states there is no emergent treatment necessary and recommended she follow up with her surgeon, Dr. Lucia Gaskins. He will send a message to him alerting of this issue. Patient feels comfort with this plan and declines pain medicine on an outpatient basis. Discussed strict return precautions.    Sherwood Gambler, MD 09/26/15 [id number]

## 2015-09-26 NOTE — ED Notes (Signed)
Pt reports bilateral groin pain for years, pcp told her it was normal pain, however at church today she developed severe pain in area while trying to sit

## 2015-09-26 NOTE — Discharge Instructions (Signed)
Your Lap Band tubing has become loose and is now in your lower abdomen. Call your surgeon, Dr. Lucia Gaskins, for an appointment to help fix this. If you develop worse abdominal pain, fevers, vomiting or other concerning symptoms, return to the ER immediately.

## 2015-09-30 ENCOUNTER — Other Ambulatory Visit: Payer: Self-pay | Admitting: Surgery

## 2015-10-10 ENCOUNTER — Emergency Department (HOSPITAL_COMMUNITY)
Admission: EM | Admit: 2015-10-10 | Discharge: 2015-10-11 | Disposition: A | Discharge status: Home / Self Care | Payer: Medicare Other | Attending: Emergency Medicine | Admitting: Emergency Medicine

## 2015-10-10 ENCOUNTER — Encounter (HOSPITAL_COMMUNITY): Payer: Self-pay | Admitting: Emergency Medicine

## 2015-10-10 DIAGNOSIS — Z9884 Bariatric surgery status: Secondary | ICD-10-CM | POA: Diagnosis not present

## 2015-10-10 DIAGNOSIS — Z90710 Acquired absence of both cervix and uterus: Secondary | ICD-10-CM | POA: Diagnosis not present

## 2015-10-10 DIAGNOSIS — M069 Rheumatoid arthritis, unspecified: Secondary | ICD-10-CM | POA: Diagnosis not present

## 2015-10-10 DIAGNOSIS — R109 Unspecified abdominal pain: Secondary | ICD-10-CM | POA: Diagnosis present

## 2015-10-10 DIAGNOSIS — Z7984 Long term (current) use of oral hypoglycemic drugs: Secondary | ICD-10-CM | POA: Diagnosis not present

## 2015-10-10 DIAGNOSIS — I10 Essential (primary) hypertension: Secondary | ICD-10-CM | POA: Insufficient documentation

## 2015-10-10 DIAGNOSIS — Z9851 Tubal ligation status: Secondary | ICD-10-CM | POA: Insufficient documentation

## 2015-10-10 DIAGNOSIS — R3915 Urgency of urination: Secondary | ICD-10-CM | POA: Diagnosis not present

## 2015-10-10 DIAGNOSIS — Z9889 Other specified postprocedural states: Secondary | ICD-10-CM | POA: Diagnosis not present

## 2015-10-10 DIAGNOSIS — Z853 Personal history of malignant neoplasm of breast: Secondary | ICD-10-CM | POA: Diagnosis not present

## 2015-10-10 DIAGNOSIS — R1030 Lower abdominal pain, unspecified: Secondary | ICD-10-CM | POA: Diagnosis not present

## 2015-10-10 DIAGNOSIS — Z79899 Other long term (current) drug therapy: Secondary | ICD-10-CM | POA: Diagnosis not present

## 2015-10-10 DIAGNOSIS — K219 Gastro-esophageal reflux disease without esophagitis: Secondary | ICD-10-CM | POA: Diagnosis not present

## 2015-10-10 DIAGNOSIS — R110 Nausea: Secondary | ICD-10-CM | POA: Insufficient documentation

## 2015-10-10 LAB — URINALYSIS, ROUTINE W REFLEX MICROSCOPIC
BILIRUBIN URINE: NEGATIVE
GLUCOSE, UA: NEGATIVE mg/dL
HGB URINE DIPSTICK: NEGATIVE
KETONES UR: NEGATIVE mg/dL
Nitrite: NEGATIVE
PROTEIN: NEGATIVE mg/dL
Specific Gravity, Urine: 1.024 (ref 1.005–1.030)
pH: 5.5 (ref 5.0–8.0)

## 2015-10-10 LAB — COMPREHENSIVE METABOLIC PANEL
ALT: 19 U/L (ref 14–54)
AST: 19 U/L (ref 15–41)
Albumin: 4.2 g/dL (ref 3.5–5.0)
Alkaline Phosphatase: 75 U/L (ref 38–126)
Anion gap: 10 (ref 5–15)
BUN: 17 mg/dL (ref 6–20)
CHLORIDE: 106 mmol/L (ref 101–111)
CO2: 28 mmol/L (ref 22–32)
Calcium: 10.7 mg/dL — ABNORMAL HIGH (ref 8.9–10.3)
Creatinine, Ser: 0.7 mg/dL (ref 0.44–1.00)
GFR calc Af Amer: >60 mL/min (ref >60)
GFR calc non Af Amer: >60 mL/min (ref >60)
GLUCOSE: 108 mg/dL — AB (ref 65–99)
Potassium: 3.7 mmol/L (ref 3.5–5.1)
Sodium: 144 mmol/L (ref 135–145)
Total Bilirubin: 0.5 mg/dL (ref 0.3–1.2)
Total Protein: 7.4 g/dL (ref 6.5–8.1)

## 2015-10-10 LAB — CBC WITH DIFFERENTIAL/PLATELET
BASOS ABS: 0 10*3/uL (ref 0.0–0.1)
Basophils Relative: 0 %
EOS ABS: 0.1 10*3/uL (ref 0.0–0.7)
EOS PCT: 2 %
HCT: 34.8 % — ABNORMAL LOW (ref 36.0–46.0)
Hemoglobin: 11 g/dL — ABNORMAL LOW (ref 12.0–15.0)
Lymphocytes Relative: 34 %
Lymphs Abs: 2.4 10*3/uL (ref 0.7–4.0)
MCH: 28.4 pg (ref 26.0–34.0)
MCHC: 31.6 g/dL (ref 30.0–36.0)
MCV: 89.7 fL (ref 78.0–100.0)
Monocytes Absolute: 0.6 10*3/uL (ref 0.1–1.0)
Monocytes Relative: 9 %
Neutro Abs: 3.9 10*3/uL (ref 1.7–7.7)
Neutrophils Relative %: 55 %
PLATELETS: 209 10*3/uL (ref 150–400)
RBC: 3.88 MIL/uL (ref 3.87–5.11)
RDW: 15.7 % — AB (ref 11.5–15.5)
WBC: 7 10*3/uL (ref 4.0–10.5)

## 2015-10-10 LAB — URINE MICROSCOPIC-ADD ON

## 2015-10-10 LAB — LIPASE, BLOOD: LIPASE: 49 U/L (ref 11–51)

## 2015-10-10 MED ORDER — MORPHINE SULFATE (PF) 4 MG/ML IV SOLN
4.0000 mg | Freq: Once | INTRAVENOUS | Status: AC
  Administered 2015-10-10: 4 mg via INTRAVENOUS
  Filled 2015-10-10: qty 1

## 2015-10-10 MED ORDER — SODIUM CHLORIDE 0.9 % IV BOLUS (SEPSIS)
1000.0000 mL | Freq: Once | INTRAVENOUS | Status: AC
  Administered 2015-10-10: 1000 mL via INTRAVENOUS

## 2015-10-10 NOTE — ED Provider Notes (Signed)
CSN: [id number]     Arrival date & time 10/10/15  1844 History   First MD Initiated Contact with Patient 10/10/15 2152     Chief Complaint  Patient presents with  . Abdominal Pain    HPI   [name] is a 55 y.o. female with a PMH of lap band surgery, HTN, breast cancer, GERD who presents to the ED with lower abdominal pain. She reports her pain started this morning and has been constant since that time. She characterizes her pain as "pulling." She reports movement exacerbates her pain. She has tried tylenol for symptom relief, which has not been effective. She states she had lap band surgery in 2009, and was evaluated in the ED on January 1st, at which time she had a CT scan that demonstrated disconnected catheter tubing. She reports she followed up with her surgeon, and is supposed to undergo operative management. She denies fever, chills, chest pain, shortness of breath. She reports nausea, though denies vomiting. She denies diarrhea, constipation, dysuria, vaginal discharge. She reports urinary urgency, and notes she feels like something is pressing on her bladder.   Past Medical History  Diagnosis Date  . LAP-BAND surgery status   . Hypertension   . Morbid obesity ([place])   . Breast cancer ([place]) 2008  . Arthritis   . Rheumatoid arthritis(714.0) 2013    Feet, joints, hands  . RA (rheumatoid arthritis) ([place]) 2013  . GERD (gastroesophageal reflux disease)   . Allergy    Past Surgical History  Procedure Laterality Date  . Total knee arthroplasty Bilateral 12/2009; 02/2010  . Abdominal hysterectomy    . Laparoscopic gastric banding  07/2008  . Cesarean section  1979 and 1978  . Tympanic membrane repair Left 1983    Ear drum plate  . Breast reconstruction Bilateral 06/2010  . Mastectomy Bilateral 2008  . Tubal ligation    . Breast reconstruction Bilateral 01/05/2014    Procedure: BILATERAL RECONSTRUCTION REVISION AND FLUIDS TO IMPLANTS AND REMOVAL IMPLANT PORTALS;  Surgeon: Cristine Polio, MD;  Location: [facility];  Service: Plastics;  Laterality: Bilateral;   Family History  Problem Relation Age of Onset  . Cancer Mother 34    Breast cancer  . Cancer Father     Colon cancer  . Cancer Sister 58    Breast cancer  . Diabetes Sister   . Cancer Maternal Aunt     Breast cancer  . Cancer Paternal Aunt     Breast cancer  . Cancer Cousin     Breast cancer   Social History  Substance Use Topics  . Smoking status: Never Smoker   . Smokeless tobacco: Never Used  . Alcohol Use: No   OB History    No data available      Review of Systems  Constitutional: Negative for fever and chills.  Respiratory: Negative for shortness of breath.   Cardiovascular: Negative for chest pain.  Gastrointestinal: Positive for nausea and abdominal pain. Negative for diarrhea, constipation and blood in stool.  Genitourinary: Positive for urgency. Negative for dysuria and frequency.  All other systems reviewed and are negative.     Allergies  Review of patient's allergies indicates no known allergies.  Home Medications   Prior to Admission medications   Medication Sig Start Date End Date Taking? Authorizing Provider  Calcium Carbonate-Vitamin D (CALCIUM 600 + D PO) Take 1 tablet by mouth daily.     Yes Historical Provider, MD  cetirizine (ZYRTEC) 10  MG tablet Take 10 mg by mouth daily.    Yes Historical Provider, MD  folic acid (FOLVITE) 1 MG tablet Take 1 mg by mouth daily.  10/27/12  Yes Historical Provider, MD  hydrochlorothiazide (HYDRODIURIL) 25 MG tablet Take 25 mg by mouth daily.     Yes Historical Provider, MD  hydroxychloroquine (PLAQUENIL) 200 MG tablet Take 200 mg by mouth daily.   Yes Historical Provider, MD  losartan (COZAAR) 50 MG tablet Take 50 mg by mouth daily.   Yes Historical Provider, MD  metFORMIN (GLUCOPHAGE-XR) 500 MG 24 hr tablet Take 500 mg by mouth every evening.   Yes Historical Provider, MD  methotrexate (RHEUMATREX) 2.5 MG tablet  Take 25 mg by mouth once a week.  10/02/12  Yes Historical Provider, MD  Omeprazole (PRILOSEC PO) Take 20 mg by mouth daily as needed (for acid reflux/heartburn).    Yes Historical Provider, MD  cephALEXin (KEFLEX) 500 MG capsule Take 1 capsule (500 mg total) by mouth 2 (two) times daily. 10/11/15   [place] Sabin Westfall, PA-C    BP 143/50 mmHg  Pulse 76  Temp(Src) 98 F (36.7 C) (Oral)  Resp 20  SpO2 97% Physical Exam  Constitutional: She is oriented to person, place, and time. She appears well-developed and well-nourished. No distress.  HENT:  Head: Normocephalic and atraumatic.  Right Ear: External ear normal.  Left Ear: External ear normal.  Nose: Nose normal.  Mouth/Throat: Uvula is midline, oropharynx is clear and moist and mucous membranes are normal.  Eyes: Conjunctivae, EOM and lids are normal. Pupils are equal, round, and reactive to light. Right eye exhibits no discharge. Left eye exhibits no discharge. No scleral icterus.  Neck: Normal range of motion. Neck supple.  Cardiovascular: Normal rate, regular rhythm, normal heart sounds, intact distal pulses and normal pulses.   Pulmonary/Chest: Effort normal and breath sounds normal. No respiratory distress. She has no wheezes. She has no rales.  Abdominal: Soft. Normal appearance and bowel sounds are normal. She exhibits no distension and no mass. There is tenderness. There is no rigidity, no rebound and no guarding.  TTP in suprapubic region. No rebound, guarding, or masses.  Musculoskeletal: Normal range of motion. She exhibits no edema or tenderness.  Neurological: She is alert and oriented to person, place, and time.  Skin: Skin is warm, dry and intact. No rash noted. She is not diaphoretic. No erythema. No pallor.  Psychiatric: She has a normal mood and affect. Her speech is normal and behavior is normal.  Nursing note and vitals reviewed.   ED Course  Procedures (including critical care time)  Labs Review Labs Reviewed   CBC WITH DIFFERENTIAL/PLATELET - Abnormal; Notable for the following:    Hemoglobin 11.0 (*)    HCT 34.8 (*)    RDW 15.7 (*)    All other components within normal limits  COMPREHENSIVE METABOLIC PANEL - Abnormal; Notable for the following:    Glucose, Bld 108 (*)    Calcium 10.7 (*)    All other components within normal limits  URINALYSIS, ROUTINE W REFLEX MICROSCOPIC (NOT AT [facility]) - Abnormal; Notable for the following:    APPearance CLOUDY (*)    Leukocytes, UA TRACE (*)    All other components within normal limits  URINE MICROSCOPIC-ADD ON - Abnormal; Notable for the following:    Squamous Epithelial / LPF 0-5 (*)    Bacteria, UA RARE (*)    All other components within normal limits  URINE CULTURE  LIPASE, BLOOD  Imaging Review No results found.   I have personally reviewed and evaluated these images and lab results as part of my medical decision-making.   EKG Interpretation None      MDM   Final diagnoses:  Abdominal pain  Abdominal pain    55 year old female presents with lower abdominal pain. She was evaluated in the ED on January 1st for the same symptoms, at which time she had a CT of her abdomen and pelvis, which revealed prior gastric band in place and catheter tubing disconnected and resting in pelvis, which was thought to be contributing to her pain. She followed up with her surgeon, and is supposed to schedule surgery, however has not done so yet. States her pain recurred today, prompting her to come to the ED.  Patient is afebrile. Vital signs stable. Abdomen soft, nondistended, with tenderness to palpation in suprapubic region. No rebound, guarding, or masses.  CBC negative for leukocytosis, hemoglobin 11, which appears improved from prior. CMP unremarkable. Lipase within normal limits. UA with trace leukocytes, 6-30 WBC, rare bacteria. Urine culture ordered.  Patient given fluids and pain medication. She reports persistent pain. Will obtain CT abdomen and  pelvis. Patient discussed with Dr. Tomi Bamberger.  Given urinary urgency and UA findings, will treat with keflex. CT abdomen pelvis pending. Patient signed out to Charlann Lange, PA-C at shift change. If imaging normal, patient stable for discharge and close follow-up with her surgeon.  BP 143/50 mmHg  Pulse 76  Temp(Src) 98 F (36.7 C) (Oral)  Resp 20  SpO2 97%   Marella Chimes, PA-C 10/11/15 0120  Dorie Rank, MD 10/12/15 1029

## 2015-10-10 NOTE — ED Notes (Signed)
Pt states that she had lap band surgery 9 years ago. States that she started having pain on 1/1 and went to ER on 68 and was told that her lap band broke loose.  States that she is supposed to be having surgery but it is not scheduled.  States she cannot deal with the pain.

## 2015-10-10 NOTE — ED Notes (Signed)
Pt had lap band surgery 9 years ago and now her lap band hardware has broken.  She wants to have it removed and states that she is waiting on [facility] to schedule her for the revision.

## 2015-10-11 ENCOUNTER — Other Ambulatory Visit: Payer: Self-pay | Admitting: Surgery

## 2015-10-11 ENCOUNTER — Encounter (HOSPITAL_COMMUNITY): Payer: Self-pay

## 2015-10-11 ENCOUNTER — Emergency Department (HOSPITAL_COMMUNITY): Payer: Medicare Other

## 2015-10-11 MED ORDER — MORPHINE SULFATE (PF) 4 MG/ML IV SOLN
6.0000 mg | Freq: Once | INTRAVENOUS | Status: AC
  Administered 2015-10-11: 6 mg via INTRAVENOUS
  Filled 2015-10-11: qty 2

## 2015-10-11 MED ORDER — HYDROCODONE-ACETAMINOPHEN 5-325 MG PO TABS: 1.0000 | ORAL_TABLET | ORAL | Status: DC | PRN

## 2015-10-11 MED ORDER — IOHEXOL 300 MG/ML  SOLN
100.0000 mL | Freq: Once | INTRAMUSCULAR | Status: AC | PRN
  Administered 2015-10-11: 100 mL via INTRAVENOUS

## 2015-10-11 MED ORDER — CEPHALEXIN 500 MG PO CAPS: 500.0000 mg | ORAL_CAPSULE | Freq: Two times a day (BID) | ORAL | Status: DC

## 2015-10-11 MED ORDER — IOHEXOL 300 MG/ML  SOLN
25.0000 mL | Freq: Once | INTRAMUSCULAR | Status: AC | PRN
  Administered 2015-10-11: 25 mL via ORAL

## 2015-10-11 NOTE — ED Notes (Signed)
Patient transported to CT 

## 2015-10-11 NOTE — Discharge Instructions (Signed)
1. Medications: keflex, usual home medications 2. Treatment: rest, drink plenty of fluids 3. Follow Up: please followup with Dr. Lucia Gaskins for discussion of your diagnoses and further evaluation after today's visit; please return to the ER for severe abdominal pain, persistent vomiting, new or worsening symptoms   Abdominal Pain, Adult Many things can cause abdominal pain. Usually, abdominal pain is not caused by a disease and will improve without treatment. It can often be observed and treated at home. Your health care provider will do a physical exam and possibly order blood tests and X-rays to help determine the seriousness of your pain. However, in many cases, more time must pass before a clear cause of the pain can be found. Before that point, your health care provider may not know if you need more testing or further treatment. HOME CARE INSTRUCTIONS Monitor your abdominal pain for any changes. The following actions may help to alleviate any discomfort you are experiencing:  Only take over-the-counter or prescription medicines as directed by your health care provider.  Do not take laxatives unless directed to do so by your health care provider.  Try a clear liquid diet (broth, tea, or water) as directed by your health care provider. Slowly move to a bland diet as tolerated. SEEK MEDICAL CARE IF:  You have unexplained abdominal pain.  You have abdominal pain associated with nausea or diarrhea.  You have pain when you urinate or have a bowel movement.  You experience abdominal pain that wakes you in the night.  You have abdominal pain that is worsened or improved by eating food.  You have abdominal pain that is worsened with eating fatty foods.  You have a fever. SEEK IMMEDIATE MEDICAL CARE IF:  Your pain does not go away within 2 hours.  You keep throwing up (vomiting).  Your pain is felt only in portions of the abdomen, such as the right side or the left lower portion of the  abdomen.  You pass bloody or black tarry stools. MAKE SURE YOU:  Understand these instructions.  Will watch your condition.  Will get help right away if you are not doing well or get worse.   This information is not intended to replace advice given to you by your health care provider. Make sure you discuss any questions you have with your health care provider.   Document Released: 06/21/2005 Document Revised: 06/02/2015 Document Reviewed: 05/21/2013 Elsevier Interactive Patient Education [employer].

## 2015-10-11 NOTE — ED Provider Notes (Signed)
Patient signed out at end of shift from [facility], [place]. Seen initially on 1/1 in the emergency department for lower abdominal pain, returns tonight with similar ongoing pain.  Previous lap band surgery - nonemergent findings on CT on 09/26/15 with plan to f/u with surgery in  outpatient setting. Patient has not followed up yet.  Recurrent pain today - no fever, no other symptoms Pending CT  Plan: if CT negative, f/u surgery as planned previously  UA ?? Infection, culture pending D/C w/Keflex  CT showing only findings previously discovered on 09/26/15. The patient can be discharged home and is encouraged to follow up with surgery. Keflex provided for marginal urine in the setting of symptoms consistent with UTI.   Charlann Lange, PA-C 10/11/15 [id number]  Dorie Rank, MD 10/12/15 [phone]

## 2015-10-12 LAB — URINE CULTURE

## 2015-10-13 NOTE — Patient Instructions (Addendum)
[name]  10/13/2015   Your procedure is scheduled on: Monday 10/18/2015  Report to [facility]  Entrance take [facility]  elevators to 3rd floor to  [place] at  105 PM  Call this number if you have problems the morning of surgery [phone]   Remember: ONLY 1 PERSON MAY GO WITH YOU TO SHORT STAY TO GET  READY MORNING OF YOUR SURGERY.   Do not eat food  :After Midnight.   MAY HAVE CLEAR LIQUIDS FROM MIDNIGHT UP UNTIL 0905 AM THEN NOTHING UNTIL AFTER SURGERY!     Take these medicines the morning of surgery with A SIP OF WATER: omeprazole              DO NOT TAKE ANY DIABETIC MEDICATIONS DAY OF YOUR SURGERY!                               You may not have any metal on your body including hair pins and              piercings  Do not wear jewelry, make-up, lotions, powders or perfumes, deodorant             Do not wear nail polish.  Do not shave  48 hours prior to surgery.              Men may shave face and neck.   Do not bring valuables to the hospital. [place].  Contacts, dentures or bridgework may not be worn into surgery.  Leave suitcase in the car. After surgery it may be brought to your room.     Patients discharged the day of surgery will not be allowed to drive home.  Name and phone number of your driver:  Special Instructions: N/A              Please read over the following fact sheets you were given: _____________________________________________________________________             [facility] - Preparing for Surgery Before surgery, you can play an important role.  Because skin is not sterile, your skin needs to be as free of germs as possible.  You can reduce the number of germs on your skin by washing with CHG (chlorahexidine gluconate) soap before surgery.  CHG is an antiseptic cleaner which kills germs and bonds with the skin to continue killing germs even after  washing. Please DO NOT use if you have an allergy to CHG or antibacterial soaps.  If your skin becomes reddened/irritated stop using the CHG and inform your nurse when you arrive at Short Stay. Do not shave (including legs and underarms) for at least 48 hours prior to the first CHG shower.  You may shave your face/neck. Please follow these instructions carefully:  1.  Shower with CHG Soap the night before surgery and the  morning of Surgery.  2.  If you choose to wash your hair, wash your hair first as usual with your  normal  shampoo.  3.  After you shampoo, rinse your hair and body thoroughly to remove the  shampoo.  4.  Use CHG as you would any other liquid soap.  You can apply chg directly  to the skin and wash                       Gently with a scrungie or clean washcloth.  5.  Apply the CHG Soap to your body ONLY FROM THE NECK DOWN.   Do not use on face/ open                           Wound or open sores. Avoid contact with eyes, ears mouth and genitals (private parts).                       Wash face,  Genitals (private parts) with your normal soap.             6.  Wash thoroughly, paying special attention to the area where your surgery  will be performed.  7.  Thoroughly rinse your body with warm water from the neck down.  8.  DO NOT shower/wash with your normal soap after using and rinsing off  the CHG Soap.                9.  Pat yourself dry with a clean towel.            10.  Wear clean pajamas.            11.  Place clean sheets on your bed the night of your first shower and do not  sleep with pets. Day of Surgery : Do not apply any lotions/deodorants the morning of surgery.  Please wear clean clothes to the hospital/surgery center.  FAILURE TO FOLLOW THESE INSTRUCTIONS MAY RESULT IN THE CANCELLATION OF YOUR SURGERY PATIENT SIGNATURE_________________________________  NURSE  SIGNATURE__________________________________  ________________________________________________________________________   [name]  An incentive spirometer is a tool that can help keep your lungs clear and active. This tool measures how well you are filling your lungs with each breath. Taking long deep breaths may help reverse or decrease the chance of developing breathing (pulmonary) problems (especially infection) following:  A long period of time when you are unable to move or be active. BEFORE THE PROCEDURE   If the spirometer includes an indicator to show your best effort, your nurse or respiratory therapist will set it to a desired goal.  If possible, sit up straight or lean slightly forward. Try not to slouch.  Hold the incentive spirometer in an upright position. INSTRUCTIONS FOR USE  1. Sit on the edge of your bed if possible, or sit up as far as you can in bed or on a chair. 2. Hold the incentive spirometer in an upright position. 3. Breathe out normally. 4. Place the mouthpiece in your mouth and seal your lips tightly around it. 5. Breathe in slowly and as deeply as possible, raising the piston or the ball toward the top of the column. 6. Hold your breath for 3-5 seconds or for as long as possible. Allow the piston or ball to fall to the bottom of the column. 7. Remove the mouthpiece from your mouth and breathe out normally. 8. Rest for a few seconds and repeat Steps 1 through 7 at least 10 times every 1-2 hours when you are awake. Take your time and take a few normal breaths between deep breaths. 9. The spirometer may include an indicator to show  your best effort. Use the indicator as a goal to work toward during each repetition. 10. After each set of 10 deep breaths, practice coughing to be sure your lungs are clear. If you have an incision (the cut made at the time of surgery), support your incision when coughing by placing a pillow or rolled up towels firmly  against it. Once you are able to get out of bed, walk around indoors and cough well. You may stop using the incentive spirometer when instructed by your caregiver.  RISKS AND COMPLICATIONS  Take your time so you do not get dizzy or light-headed.  If you are in pain, you may need to take or ask for pain medication before doing incentive spirometry. It is harder to take a deep breath if you are having pain. AFTER USE  Rest and breathe slowly and easily.  It can be helpful to keep track of a log of your progress. Your caregiver can provide you with a simple table to help with this. If you are using the spirometer at home, follow these instructions: [place] IF:   You are having difficultly using the spirometer.  You have trouble using the spirometer as often as instructed.  Your pain medication is not giving enough relief while using the spirometer.  You develop fever of 100.5 F (38.1 C) or higher. SEEK IMMEDIATE MEDICAL CARE IF:   You cough up bloody sputum that had not been present before.  You develop fever of 102 F (38.9 C) or greater.  You develop worsening pain at or near the incision site. MAKE SURE YOU:   Understand these instructions.  Will watch your condition.  Will get help right away if you are not doing well or get worse. Document Released: 01/22/2007 Document Revised: 12/04/2011 Document Reviewed: 03/25/2007 ExitCare Patient Information 2014 ExitCare, [place].   ________________________________________________________________________    CLEAR LIQUID DIET   Foods Allowed                                                                     Foods Excluded  Coffee and tea, regular and decaf                             liquids that you cannot  Plain Jell-O in any flavor                                             see through such as: Fruit ices (not with fruit pulp)                                     milk, soups, orange juice  Iced Popsicles                                     All solid food Carbonated beverages, regular and diet  Cranberry, grape and apple juices Sports drinks like Gatorade Lightly seasoned clear broth or consume(fat free) Sugar, honey syrup  Sample Menu Breakfast                                Lunch                                     Supper Cranberry juice                    Beef broth                            Chicken broth Jell-O                                     Grape juice                           Apple juice Coffee or tea                        Jell-O                                      Popsicle                                                Coffee or tea                        Coffee or tea  _____________________________________________________________________

## 2015-10-15 ENCOUNTER — Encounter (HOSPITAL_COMMUNITY)
Admission: RE | Admit: 2015-10-15 | Discharge: 2015-10-15 | Disposition: A | Discharge status: Home / Self Care | Payer: Medicare Other | Source: Ambulatory Visit | Attending: Surgery | Admitting: Surgery

## 2015-10-15 ENCOUNTER — Encounter (HOSPITAL_COMMUNITY): Payer: Self-pay

## 2015-10-15 DIAGNOSIS — Z853 Personal history of malignant neoplasm of breast: Secondary | ICD-10-CM | POA: Diagnosis not present

## 2015-10-15 DIAGNOSIS — Z6841 Body Mass Index (BMI) 40.0 and over, adult: Secondary | ICD-10-CM | POA: Diagnosis not present

## 2015-10-15 DIAGNOSIS — K436 Other and unspecified ventral hernia with obstruction, without gangrene: Secondary | ICD-10-CM | POA: Diagnosis not present

## 2015-10-15 DIAGNOSIS — Z79899 Other long term (current) drug therapy: Secondary | ICD-10-CM | POA: Diagnosis not present

## 2015-10-15 DIAGNOSIS — I10 Essential (primary) hypertension: Secondary | ICD-10-CM | POA: Diagnosis not present

## 2015-10-15 DIAGNOSIS — Z96653 Presence of artificial knee joint, bilateral: Secondary | ICD-10-CM | POA: Diagnosis not present

## 2015-10-15 DIAGNOSIS — Z1501 Genetic susceptibility to malignant neoplasm of breast: Secondary | ICD-10-CM | POA: Diagnosis not present

## 2015-10-15 DIAGNOSIS — R001 Bradycardia, unspecified: Secondary | ICD-10-CM | POA: Diagnosis not present

## 2015-10-15 DIAGNOSIS — Y831 Surgical operation with implant of artificial internal device as the cause of abnormal reaction of the patient, or of later complication, without mention of misadventure at the time of the procedure: Secondary | ICD-10-CM | POA: Diagnosis not present

## 2015-10-15 DIAGNOSIS — K660 Peritoneal adhesions (postprocedural) (postinfection): Secondary | ICD-10-CM | POA: Diagnosis not present

## 2015-10-15 DIAGNOSIS — K9509 Other complications of gastric band procedure: Secondary | ICD-10-CM | POA: Diagnosis present

## 2015-10-15 DIAGNOSIS — Z7984 Long term (current) use of oral hypoglycemic drugs: Secondary | ICD-10-CM | POA: Diagnosis not present

## 2015-10-15 DIAGNOSIS — M069 Rheumatoid arthritis, unspecified: Secondary | ICD-10-CM | POA: Diagnosis not present

## 2015-10-15 DIAGNOSIS — K219 Gastro-esophageal reflux disease without esophagitis: Secondary | ICD-10-CM | POA: Diagnosis not present

## 2015-10-15 DIAGNOSIS — R1030 Lower abdominal pain, unspecified: Secondary | ICD-10-CM | POA: Diagnosis not present

## 2015-10-15 DIAGNOSIS — Z9013 Acquired absence of bilateral breasts and nipples: Secondary | ICD-10-CM | POA: Diagnosis not present

## 2015-10-16 LAB — HEMOGLOBIN A1C
Hgb A1c MFr Bld: 6.1 % — ABNORMAL HIGH (ref 4.8–5.6)
Mean Plasma Glucose: 128 mg/dL

## 2015-10-17 MED ORDER — CEFAZOLIN SODIUM 10 G IJ SOLR
3.0000 g | INTRAMUSCULAR | Status: AC
  Filled 2015-10-17: qty 3000

## 2015-10-17 NOTE — H&P (Signed)
[name]  Location: [facility] Patient #: [id number] DOB: Mar 23, 1961 Divorced / Language: English / Race: Black or African American Female   History of Present Illness   The patient is a 55 year old female who presents with a complaint of lap band malfunction.   Her PCP is Dr. Carlis Stable.  She comes by her self.   The patient developed some stomach bloating on Saturday, September 25, 2015. She then developed suprapubic stabbing pain on Sunday, 26 September 2015. She went to the [facility] ER on 26 September 2015. A CT scan of the abdomen on 26 September 2015 showed the catheter tubing of the lap band had become "disconnected". She was seen in our office by Dr. Kieth Brightly on 26 September 2014 who then referred to me. I actually think that the tubing broke - in that separation is not in a natural place that there is a break in the lap band tubing.  The patient has been somewhat irregular in her follow-up with a lap band. I last saw her in February 2014. Up until around April 2016 she was having trouble with vomiting frequently with a lap band. However since early summer, many the symptoms have improved. She still has some trouble with certain foods, such as beef.  It is unclear to me as to when her lap band tubing broke. She still appears to have some resistance with her lap band. I did not access her port today. Her weight is down 10 pounds from her last visit in February 2014.  I spent about 20 minutes talking to her about the options of managing a lap band. These include repairing the band versus replacing the lap band versus removing the lap band versus converting her to another weight loss operation. She has her a lot of negative things about the lap band and is interested in having this removed. She is not interested in alternative weight loss operation at this time. She understands that she could gain weight with removal of the lap  band. After this discussion, she still wants to proceed with lap band removal. I told her that removing the lap band may not change her suprapubic pain.  History of lap band - APS - 08/04/2008 I had not seen Ms. Allport since Feb 2012. She started with a weight of 309. He lowest weight was 261 on 10/2008, she is now back to 281. (note: this is down from 290 when I saw her in Feb 2014.  Past Medical History: 2. History of bilateral mastectomies/bilateral breast cancer - 2008 Left side - T2, N1a, right - T1b, N1a Was seeing Thomasene Ripple for oncology. Dr. Towanda Malkin placed bilateral tissue expanders last year. 3. BRCA 2 postivie 4. Hypertension 5. Bilateral knee replacement - 2010 - M. Alvan Dame Has not seen Dr. Alvan Dame in a while 6. History of back issues. - She has not seen Dr. Nelva Bush in a while 7. Rheumatoid arthritis Sees Dr. Lenna Gilford On methotrexate Her primary complaint is her right foot 8. On chronic disability since 2009  Social History Unmarried. Has two children - 72 and 76 yo She has two sisters whom she has talked to about the lap band. Neither has had weight loss surgery.   Allergies (Sonya Bynum, CMA; 09/30/2015 2:00 PM) No Known Drug Allergies01/11/2015  Medication History (Sonya Bynum, CMA; 09/30/2015 2:00 PM) Calcium Carbonate (600MG Tablet, Oral) Active. ZyrTEC (10MG Tablet, Oral) Active. HydroCHLOROthiazide (25MG Tablet, Oral) Active. Losartan Potassium (50MG Tablet, Oral) Active.  MetFORMIN HCl (1000MG Tablet, Oral) Active. Methotrexate Sodium (2.5MG Tablet, Oral) Active. PriLOSEC (10MG Packet, Oral) Active. Folic Acid (1MG Tablet, Oral) Active. Medications Reconciled  Vitals (Sonya Bynum CMA; 09/30/2015 2:00 PM) 09/30/2015 2:00 PM Weight: 281.4 lb Height: 64in Body Surface Area: 2.26 m Body Mass Index: 48.3 kg/m  Pulse: 83 (Regular)   BP: 142/84 (Sitting, Left Arm, Standard)   Physical Exam  General: Obese AA F alert and generally healthy appearing. HEENT: Normal. Pupils equal.  Neck: Supple. No mass. No thyroid mass. Lymph Nodes: No supraclavicular or cervical nodes.  Lungs: Clear to auscultation and symmetric breath sounds. Heart: RRR. No murmur or rub.  Abdomen: Soft. No mass. No hernia. Normal bowel sounds. Port in [place]. She has a well healed lower abdominal wall scar, probably from her abdominal hysterectomy. It is in this scar that she has some tenderness  Extremities: Good strength and ROM in upper and lower extremities.  Neurologic: Grossly intact to motor and sensory function. Psychiatric: Has normal mood and affect. Behavior is normal.  Assessment & Plan  1.  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   She just wants to remove the lap band.  Will schedule surgery.  2. 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 Tonetta Napoles 3.  MORBID OBESITY WITH BMI OF 45.0-49.9, ADULT (E66.01) 4.  LOWER ABDOMINAL PAIN OF UNKNOWN ETIOLOGY (R10.30)   Addendum Note(Nicolena Schurman H. Lucia Gaskins MD; 10/11/2015 7:55 AM)  Came to ER on 10/10/2015 for abodminal pain again. A repeat CT scan showed nothing new.  5. History of bilateral mastectomies/bilateral breast cancer - 2008 Left side - T2, N1a, right - T1b, N1a Was seeing Thomasene Ripple for oncology. Dr. Towanda Malkin placed bilateral tissue expanders last year. 6. BRCA 2 postivie 7. Hypertension 8. Bilateral knee replacement - 2010 - M. Alvan Dame Has not seen Dr. Alvan Dame in a while 9. History of back issues. - She has not seen Dr. Nelva Bush in a while 10. Rheumatoid arthritis Sees Dr. Lenna Gilford On methotrexate Her  primary complaint is her right foot 11. On chronic disability since 2009  Alphonsa Overall, MD, [facility] Surgery Pager: [phone] Office phone:  [phone]

## 2015-10-18 ENCOUNTER — Inpatient Hospital Stay (HOSPITAL_COMMUNITY): Payer: Medicare Other | Admitting: Anesthesiology

## 2015-10-18 ENCOUNTER — Encounter (HOSPITAL_COMMUNITY): Payer: Self-pay | Admitting: *Deleted

## 2015-10-18 ENCOUNTER — Encounter (HOSPITAL_COMMUNITY)
Admission: RE | Disposition: A | Discharge status: Home / Self Care | Payer: Self-pay | Source: Ambulatory Visit | Attending: Surgery

## 2015-10-18 ENCOUNTER — Observation Stay (HOSPITAL_COMMUNITY)
Admission: RE | Admit: 2015-10-18 | Discharge: 2015-10-19 | Disposition: A | Discharge status: Home / Self Care | Payer: Medicare Other | Source: Ambulatory Visit | Attending: Surgery | Admitting: Surgery

## 2015-10-18 DIAGNOSIS — K9509 Other complications of gastric band procedure: Secondary | ICD-10-CM | POA: Diagnosis not present

## 2015-10-18 DIAGNOSIS — Z7984 Long term (current) use of oral hypoglycemic drugs: Secondary | ICD-10-CM | POA: Insufficient documentation

## 2015-10-18 DIAGNOSIS — K219 Gastro-esophageal reflux disease without esophagitis: Secondary | ICD-10-CM | POA: Insufficient documentation

## 2015-10-18 DIAGNOSIS — Z1501 Genetic susceptibility to malignant neoplasm of breast: Secondary | ICD-10-CM | POA: Insufficient documentation

## 2015-10-18 DIAGNOSIS — R001 Bradycardia, unspecified: Secondary | ICD-10-CM | POA: Insufficient documentation

## 2015-10-18 DIAGNOSIS — Z6841 Body Mass Index (BMI) 40.0 and over, adult: Secondary | ICD-10-CM | POA: Insufficient documentation

## 2015-10-18 DIAGNOSIS — K436 Other and unspecified ventral hernia with obstruction, without gangrene: Secondary | ICD-10-CM | POA: Diagnosis not present

## 2015-10-18 DIAGNOSIS — Z96653 Presence of artificial knee joint, bilateral: Secondary | ICD-10-CM | POA: Insufficient documentation

## 2015-10-18 DIAGNOSIS — M069 Rheumatoid arthritis, unspecified: Secondary | ICD-10-CM | POA: Insufficient documentation

## 2015-10-18 DIAGNOSIS — R1030 Lower abdominal pain, unspecified: Secondary | ICD-10-CM | POA: Insufficient documentation

## 2015-10-18 DIAGNOSIS — K660 Peritoneal adhesions (postprocedural) (postinfection): Secondary | ICD-10-CM | POA: Diagnosis not present

## 2015-10-18 DIAGNOSIS — Z79899 Other long term (current) drug therapy: Secondary | ICD-10-CM | POA: Insufficient documentation

## 2015-10-18 DIAGNOSIS — Z9884 Bariatric surgery status: Secondary | ICD-10-CM

## 2015-10-18 DIAGNOSIS — Y831 Surgical operation with implant of artificial internal device as the cause of abnormal reaction of the patient, or of later complication, without mention of misadventure at the time of the procedure: Secondary | ICD-10-CM | POA: Insufficient documentation

## 2015-10-18 DIAGNOSIS — I10 Essential (primary) hypertension: Secondary | ICD-10-CM | POA: Diagnosis not present

## 2015-10-18 DIAGNOSIS — Z9013 Acquired absence of bilateral breasts and nipples: Secondary | ICD-10-CM | POA: Insufficient documentation

## 2015-10-18 DIAGNOSIS — Z853 Personal history of malignant neoplasm of breast: Secondary | ICD-10-CM | POA: Insufficient documentation

## 2015-10-18 LAB — GLUCOSE, CAPILLARY
GLUCOSE-CAPILLARY: 84 mg/dL (ref 65–99)
Glucose-Capillary: 93 mg/dL (ref 65–99)

## 2015-10-18 SURGERY — REMOVAL, GASTRIC BAND, LAPAROSCOPIC
Anesthesia: General | Site: Abdomen

## 2015-10-18 MED ORDER — CHLORHEXIDINE GLUCONATE 4 % EX LIQD: 1.0000 "application " | Freq: Once | CUTANEOUS | Status: DC

## 2015-10-18 MED ORDER — PROPOFOL 10 MG/ML IV BOLUS
INTRAVENOUS | Status: DC | PRN
  Administered 2015-10-18: 200 mg via INTRAVENOUS

## 2015-10-18 MED ORDER — ONDANSETRON HCL 4 MG/2ML IJ SOLN
INTRAMUSCULAR | Status: DC | PRN
  Administered 2015-10-18: 4 mg via INTRAVENOUS

## 2015-10-18 MED ORDER — HYDROCODONE-ACETAMINOPHEN 5-325 MG PO TABS: 1.0000 | ORAL_TABLET | ORAL | Status: DC | PRN

## 2015-10-18 MED ORDER — BUPIVACAINE-EPINEPHRINE (PF) 0.5% -1:200000 IJ SOLN
INTRAMUSCULAR | Status: AC
  Filled 2015-10-18: qty 30

## 2015-10-18 MED ORDER — METFORMIN HCL ER 500 MG PO TB24
500.0000 mg | ORAL_TABLET | Freq: Every day | ORAL | Status: DC
  Administered 2015-10-18: 500 mg via ORAL
  Filled 2015-10-18 (×2): qty 1

## 2015-10-18 MED ORDER — SUGAMMADEX SODIUM 500 MG/5ML IV SOLN
INTRAVENOUS | Status: DC | PRN
  Administered 2015-10-18: 250 mg via INTRAVENOUS

## 2015-10-18 MED ORDER — PANTOPRAZOLE SODIUM 40 MG PO TBEC
40.0000 mg | DELAYED_RELEASE_TABLET | Freq: Every day | ORAL | Status: DC
  Administered 2015-10-19: 40 mg via ORAL
  Filled 2015-10-18: qty 1

## 2015-10-18 MED ORDER — HYDROMORPHONE HCL 1 MG/ML IJ SOLN
INTRAMUSCULAR | Status: AC
  Filled 2015-10-18: qty 1

## 2015-10-18 MED ORDER — POTASSIUM CHLORIDE IN NACL 20-0.45 MEQ/L-% IV SOLN
INTRAVENOUS | Status: DC
  Administered 2015-10-18: 22:00:00 via INTRAVENOUS
  Filled 2015-10-18 (×4): qty 1000

## 2015-10-18 MED ORDER — LIDOCAINE HCL (CARDIAC) 20 MG/ML IV SOLN
INTRAVENOUS | Status: AC
  Filled 2015-10-18: qty 5

## 2015-10-18 MED ORDER — HYDROCHLOROTHIAZIDE 25 MG PO TABS
25.0000 mg | ORAL_TABLET | Freq: Every day | ORAL | Status: DC
  Administered 2015-10-19: 25 mg via ORAL
  Filled 2015-10-18: qty 1

## 2015-10-18 MED ORDER — SUCCINYLCHOLINE CHLORIDE 20 MG/ML IJ SOLN
INTRAMUSCULAR | Status: DC | PRN
  Administered 2015-10-18: 140 mg via INTRAVENOUS

## 2015-10-18 MED ORDER — HYDROMORPHONE HCL 1 MG/ML IJ SOLN
INTRAMUSCULAR | Status: DC | PRN
  Administered 2015-10-18: 1 mg via INTRAVENOUS
  Administered 2015-10-18: 0.5 mg via INTRAVENOUS

## 2015-10-18 MED ORDER — 0.9 % SODIUM CHLORIDE (POUR BTL) OPTIME
TOPICAL | Status: DC | PRN
  Administered 2015-10-18: 1000 mL

## 2015-10-18 MED ORDER — DEXTROSE 5 % IV SOLN
3.0000 g | INTRAVENOUS | Status: AC
  Administered 2015-10-18: 3 g via INTRAVENOUS
  Filled 2015-10-18: qty 3000

## 2015-10-18 MED ORDER — HEPARIN SODIUM (PORCINE) 5000 UNIT/ML IJ SOLN
5000.0000 [IU] | Freq: Three times a day (TID) | INTRAMUSCULAR | Status: DC
  Administered 2015-10-19: 5000 [IU] via SUBCUTANEOUS
  Filled 2015-10-18 (×4): qty 1

## 2015-10-18 MED ORDER — MIDAZOLAM HCL 5 MG/5ML IJ SOLN
INTRAMUSCULAR | Status: DC | PRN
  Administered 2015-10-18: 2 mg via INTRAVENOUS

## 2015-10-18 MED ORDER — ONDANSETRON 4 MG PO TBDP: 4.0000 mg | ORAL_TABLET | Freq: Four times a day (QID) | ORAL | Status: DC | PRN

## 2015-10-18 MED ORDER — HYDROXYCHLOROQUINE SULFATE 200 MG PO TABS
200.0000 mg | ORAL_TABLET | Freq: Every day | ORAL | Status: DC
  Administered 2015-10-19: 200 mg via ORAL
  Filled 2015-10-18: qty 1

## 2015-10-18 MED ORDER — LACTATED RINGERS IR SOLN
Status: DC | PRN
  Administered 2015-10-18: 1000 mL

## 2015-10-18 MED ORDER — LACTATED RINGERS IV SOLN
INTRAVENOUS | Status: DC
  Administered 2015-10-18: 18:00:00 via INTRAVENOUS
  Administered 2015-10-18: 1000 mL via INTRAVENOUS

## 2015-10-18 MED ORDER — HYDROMORPHONE HCL 2 MG/ML IJ SOLN
INTRAMUSCULAR | Status: AC
  Filled 2015-10-18: qty 1

## 2015-10-18 MED ORDER — ROCURONIUM BROMIDE 100 MG/10ML IV SOLN
INTRAVENOUS | Status: AC
  Filled 2015-10-18: qty 1

## 2015-10-18 MED ORDER — IBUPROFEN 200 MG PO TABS
600.0000 mg | ORAL_TABLET | Freq: Four times a day (QID) | ORAL | Status: DC | PRN
  Administered 2015-10-18 – 2015-10-19 (×2): 600 mg via ORAL
  Filled 2015-10-18 (×2): qty 3

## 2015-10-18 MED ORDER — SUGAMMADEX SODIUM 500 MG/5ML IV SOLN: INTRAVENOUS | Status: DC | PRN

## 2015-10-18 MED ORDER — ONDANSETRON HCL 4 MG/2ML IJ SOLN: 4.0000 mg | Freq: Four times a day (QID) | INTRAMUSCULAR | Status: DC | PRN

## 2015-10-18 MED ORDER — FENTANYL CITRATE (PF) 250 MCG/5ML IJ SOLN
INTRAMUSCULAR | Status: AC
  Filled 2015-10-18: qty 5

## 2015-10-18 MED ORDER — BUPIVACAINE-EPINEPHRINE 0.5% -1:200000 IJ SOLN
INTRAMUSCULAR | Status: DC | PRN
  Administered 2015-10-18: 15 mL

## 2015-10-18 MED ORDER — HEPARIN SODIUM (PORCINE) 5000 UNIT/ML IJ SOLN
5000.0000 [IU] | Freq: Once | INTRAMUSCULAR | Status: AC
  Administered 2015-10-18: 5000 [IU] via SUBCUTANEOUS
  Filled 2015-10-18: qty 1

## 2015-10-18 MED ORDER — MORPHINE SULFATE (PF) 2 MG/ML IV SOLN: 1.0000 mg | INTRAVENOUS | Status: DC | PRN

## 2015-10-18 MED ORDER — LOSARTAN POTASSIUM 50 MG PO TABS
50.0000 mg | ORAL_TABLET | Freq: Every day | ORAL | Status: DC
  Administered 2015-10-19: 50 mg via ORAL
  Filled 2015-10-18: qty 1

## 2015-10-18 MED ORDER — MIDAZOLAM HCL 2 MG/2ML IJ SOLN
INTRAMUSCULAR | Status: AC
  Filled 2015-10-18: qty 2

## 2015-10-18 MED ORDER — SUGAMMADEX SODIUM 500 MG/5ML IV SOLN
INTRAVENOUS | Status: AC
  Filled 2015-10-18: qty 5

## 2015-10-18 MED ORDER — PROPOFOL 10 MG/ML IV BOLUS
INTRAVENOUS | Status: AC
  Filled 2015-10-18: qty 20

## 2015-10-18 MED ORDER — FENTANYL CITRATE (PF) 100 MCG/2ML IJ SOLN
INTRAMUSCULAR | Status: DC | PRN
  Administered 2015-10-18: 50 ug via INTRAVENOUS
  Administered 2015-10-18 (×2): 100 ug via INTRAVENOUS

## 2015-10-18 MED ORDER — LIDOCAINE HCL (PF) 2 % IJ SOLN
INTRAMUSCULAR | Status: DC | PRN
  Administered 2015-10-18: 100 mg via INTRADERMAL

## 2015-10-18 MED ORDER — HYDROMORPHONE HCL 1 MG/ML IJ SOLN
0.2500 mg | INTRAMUSCULAR | Status: DC | PRN
Start: 2015-10-18 — End: 2015-10-18

## 2015-10-18 MED ORDER — ONDANSETRON HCL 4 MG/2ML IJ SOLN
INTRAMUSCULAR | Status: AC
  Filled 2015-10-18: qty 2

## 2015-10-18 MED ORDER — PROMETHAZINE HCL 25 MG/ML IJ SOLN: 6.2500 mg | INTRAMUSCULAR | Status: DC | PRN

## 2015-10-18 MED ORDER — ROCURONIUM BROMIDE 100 MG/10ML IV SOLN
INTRAVENOUS | Status: DC | PRN
  Administered 2015-10-18: 10 mg via INTRAVENOUS
  Administered 2015-10-18: 50 mg via INTRAVENOUS

## 2015-10-18 SURGICAL SUPPLY — 53 items
BLADE HEX COATED 2.75 (ELECTRODE) ×3 IMPLANT
BLADE SURG 15 STRL LF DISP TIS (BLADE) IMPLANT
BLADE SURG 15 STRL SS (BLADE)
BLADE SURG SZ11 CARB STEEL (BLADE) ×3 IMPLANT
CABLE HIGH FREQUENCY MONO STRZ (ELECTRODE) ×3 IMPLANT
COVER SURGICAL LIGHT HANDLE (MISCELLANEOUS) ×3 IMPLANT
DECANTER SPIKE VIAL GLASS SM (MISCELLANEOUS) ×6 IMPLANT
DEVICE SUT QUICK LOAD TK 5 (STAPLE) ×3 IMPLANT
DEVICE SUT TI-KNOT TK 5X26 (MISCELLANEOUS) ×1 IMPLANT
DEVICE SUTURE ENDOST 10MM (ENDOMECHANICALS) IMPLANT
DEVICE TI KNOT TK5 (MISCELLANEOUS)
DRAPE CAMERA CLOSED 9X96 (DRAPES) ×1 IMPLANT
ELECT PENCIL ROCKER SW 15FT (MISCELLANEOUS) ×3 IMPLANT
ELECT REM PT RETURN 9FT ADLT (ELECTROSURGICAL) ×3
ELECTRODE REM PT RTRN 9FT ADLT (ELECTROSURGICAL) ×1 IMPLANT
GLOVE BIOGEL PI IND STRL 7.0 (GLOVE) ×1 IMPLANT
GLOVE BIOGEL PI INDICATOR 7.0 (GLOVE) ×2
GOWN SPEC L4 XLG W/TWL (GOWN DISPOSABLE) ×3 IMPLANT
GOWN STRL REUS W/ TWL XL LVL3 (GOWN DISPOSABLE) ×3 IMPLANT
GOWN STRL REUS W/TWL LRG LVL3 (GOWN DISPOSABLE) ×3 IMPLANT
GOWN STRL REUS W/TWL XL LVL3 (GOWN DISPOSABLE) ×9
HOVERMATT SINGLE USE (MISCELLANEOUS) ×3 IMPLANT
KIT BASIN OR (CUSTOM PROCEDURE TRAY) ×3 IMPLANT
LIQUID BAND (GAUZE/BANDAGES/DRESSINGS) ×3 IMPLANT
NDL SPNL 22GX3.5 QUINCKE BK (NEEDLE) ×1 IMPLANT
NEEDLE SPNL 22GX3.5 QUINCKE BK (NEEDLE) ×3 IMPLANT
NS IRRIG 1000ML POUR BTL (IV SOLUTION) ×3 IMPLANT
PACK UNIVERSAL I (CUSTOM PROCEDURE TRAY) ×3 IMPLANT
QUICK LOAD TK 5 (STAPLE)
SET IRRIG TUBING LAPAROSCOPIC (IRRIGATION / IRRIGATOR) ×2 IMPLANT
SHEARS HARMONIC ACE PLUS 36CM (ENDOMECHANICALS) IMPLANT
SLEEVE ADV FIXATION 5X100MM (TROCAR) ×7 IMPLANT
SOLUTION ANTI FOG 6CC (MISCELLANEOUS) ×3 IMPLANT
SPONGE LAP 18X18 X RAY DECT (DISPOSABLE) ×3 IMPLANT
STAPLER VISISTAT 35W (STAPLE) ×1 IMPLANT
SUT ETHIBOND 2 0 SH (SUTURE)
SUT ETHIBOND 2 0 SH 36X2 (SUTURE) ×3 IMPLANT
SUT MNCRL AB 4-0 PS2 18 (SUTURE) ×5 IMPLANT
SUT PROLENE 2 0 CT2 30 (SUTURE) ×1 IMPLANT
SUT SILK 0 (SUTURE)
SUT SILK 0 30XBRD TIE 6 (SUTURE) ×1 IMPLANT
SUT SURGIDAC NAB ES-9 0 48 120 (SUTURE) IMPLANT
SUT VIC AB 2-0 SH 27 (SUTURE) ×3
SUT VIC AB 2-0 SH 27X BRD (SUTURE) ×1 IMPLANT
SUT VIC AB 3-0 SH 18 (SUTURE) ×2 IMPLANT
SYR 20CC LL (SYRINGE) ×3 IMPLANT
SYR CONTROL 10ML LL (SYRINGE) ×3 IMPLANT
TOWEL OR 17X26 10 PK STRL BLUE (TOWEL DISPOSABLE) ×3 IMPLANT
TROCAR ADV FIXATION 11X100MM (TROCAR) IMPLANT
TROCAR BLADELESS OPT 5 100 (ENDOMECHANICALS) ×6 IMPLANT
TROCAR XCEL NON-BLD 11X100MML (ENDOMECHANICALS) IMPLANT
TUBE CALIBRATION LAPBAND (TUBING) ×1 IMPLANT
TUBING INSUFFLATION 10FT LAP (TUBING) ×1 IMPLANT

## 2015-10-18 NOTE — Anesthesia Preprocedure Evaluation (Signed)
Anesthesia Evaluation  Patient identified by MRN, date of birth, ID band Patient awake    Reviewed: Allergy & Precautions, NPO status   Airway Mallampati: II  TM Distance: >3 FB Neck ROM: Full    Dental   Pulmonary neg pulmonary ROS,    breath sounds clear to auscultation       Cardiovascular hypertension,  Rhythm:Regular Rate:Normal     Neuro/Psych    GI/Hepatic Neg liver ROS, GERD  ,  Endo/Other  negative endocrine ROS  Renal/GU negative Renal ROS     Musculoskeletal   Abdominal   Peds  Hematology   Anesthesia Other Findings   Reproductive/Obstetrics                             Anesthesia Physical Anesthesia Plan  ASA: III  Anesthesia Plan: General   Post-op Pain Management:    Induction: Intravenous  Airway Management Planned: Oral ETT  Additional Equipment:   Intra-op Plan:   Post-operative Plan: Extubation in OR  Informed Consent: I have reviewed the patients History and Physical, chart, labs and discussed the procedure including the risks, benefits and alternatives for the proposed anesthesia with the patient or authorized representative who has indicated his/her understanding and acceptance.   Dental advisory given  Plan Discussed with: CRNA and Anesthesiologist  Anesthesia Plan Comments:         Anesthesia Quick Evaluation

## 2015-10-18 NOTE — Transfer of Care (Signed)
Immediate Anesthesia Transfer of Care Note  Patient: [name]  Procedure(s) Performed: Procedure(s): LAPAROSCOPIC REMOVAL OF GASTRIC BANDING (N/A)  Patient Location: PACU  Anesthesia Type:General  Level of Consciousness:  sedated, patient cooperative and responds to stimulation  Airway & Oxygen Therapy:Patient Spontanous Breathing and Patient connected to face mask oxgen  Post-op Assessment:  Report given to PACU RN and Post -op Vital signs reviewed and stable  Post vital signs:  Reviewed and stable  Last Vitals:  Filed Vitals:   10/18/15 1309  BP: 157/73  Pulse: 72  Temp: 36.8 C  Resp: 18    Complications: No apparent anesthesia complications

## 2015-10-18 NOTE — Op Note (Signed)
NAMENATAKI, ARTS               ACCOUNT NO.:  [id number]  MEDICAL RECORD NO.:  [id number]  LOCATION:  [id number]                         FACILITY:  [facility]  PHYSICIAN:  Fenton Malling. Lucia Gaskins, M.D.  DATE OF BIRTH:  04/02/1961  DATE OF PROCEDURE: 18 October 2015                              OPERATIVE REPORT   PREOPERATIVE DIAGNOSIS:  Broken lap band tubing.  POSTOPERATIVE DIAGNOSIS:  Broken lap band tubing.  Small abdominal wall hernia is LUQ (1 cm) with incarcerated omental knuckle, adhesions in the the lower abdomen to the anterior abdominal.  PROCEDURE:  Laparoscopic removal of gastric band.  (photos in paper chart)  SURGEON:  Fenton Malling. Lucia Gaskins, M.D.  FIRST ASSISTANT:  Marland Kitchen T. Hoxworth, M.D.  ANESTHESIA:  General endotracheal.  Local medicine was 15 mL of 0.5% Marcaine with epinephrine.  ESTIMATED BLOOD LOSS:  Minimal.  COMPLICATIONS:  None.  INDICATION FOR PROCEDURE:  Ms. Anhorn is a 54 year old African American female who is a patient of Dr. Cari Caraway.  She had an APS lap band placed on November 10,  By Dr. Keturah Barre. Divonte Senger.  Her initial weight was 309 pounds, she lost down to 261 pounds in 2010, but has gained back to 281.  She developed pelvic pain and on CT scan, was found to have the tubing of her lap band was broken. She has visited the ER twice for this pain.   I discussed with her about replacing the band versus going to her another bariatric procedure, but the patient insisted on having the lap Band completely removed and wanted no further weight loss surgery at this time.  OPERATIVE NOTE:  Ms. Bayliff was taken to room #4 at [facility], underwent a general endotracheal anesthetic.  She was given 3 g of Ancef at the end of this procedure.  Her abdomen was prepped with ChloraPrep and sterilely draped.  A time-out was held and surgical checklist run.  I accessed her abdomen with a 5-mm Ethicon trocar in her left upper quadrant trocar.  I placed four additional trocars.  I placed  a 5-mm left paramedian for the scope, a 12-mm right paramedian for removal of the lap band, a 5-mm right subcostal from a second hand, and a 5-mm subxiphoid for the liver retractor.  Abdominal exploration revealed the liver was unremarkable.  I could see scarring under the left lobe of the liver where the band was.  She had adhesions of omentum down her lower midline.  I saw the other tip of the tubing, there was no inflammation, no evidence of infection.  There was no evidence of chronic inflammation.  I first took down the scar tissue over the lap band and freed this up enough to expose the lap band and unbuckle it.  I then cut the lap band and removed in pieces, first, the small piece where the buckle, then the other piece with the broken tip, it looked like it had broken about 10- 12 cm from the lap band.  There was no obvious reason I could see where the lap band tubing was broken; otherwise, the band itself looked intact and okay.    I then reinspected the upper abdomen and  irrigated with saline.  I then turned my attention to the port.  I cut down the port, it was sewn in place with some 2-0 Prolene sutures.  I cut the sutures and removed the port and the remainder of the tubing intact down to the other end of the broken band.  I did take photos and placed it into the chart, both the proximal and distal broken tubing.  I could not find a reason for why the tubing broke.  There was nothing obviously was abnormal in the abdomen or with the tubing.  She did have a little knuckle of omentum in the hernia in her left upper quadrant.  I pulled this out.  The defect was so small, but I think I could only do to her such as surgery with taking out sutures and just left it alone.  She did have adhesions toward her pelvis.  I was able to look around this, but saw no mass, no nodularity.  She had a fairly mobile cecum, but no other abnormality in her pelvis so I could see is the cause for her pain, but I did not  do a thorough examination that I thought it would be taking out adhesions that had more risk through her surgery than benefit.  I then removed all the trocars in turn.  I did not think that she needed any fascial closure.  I irrigated the wounds.  I closed the right port site with interrupted 3-0 Vicryl suture.  The skin at each site with a 4-0 Monocryl suture and painted with LiquiBand.  The patient tolerated the procedure well, was transported to the recovery room in good condition.  Sponge and needle count were correct at the end of the case.  I will keep her overnight because it is so late in the day.   Fenton Malling. Lucia Gaskins, M.D., [facility], scribe for Epic   DHN/MEDQ  D:  10/18/2015  T:  10/18/2015  Job:  [id number]  cc:   Leitha Bleak, M.D. Fax: [phone]

## 2015-10-18 NOTE — Interval H&P Note (Signed)
History and Physical Interval Note:  10/18/2015 2:40 PM  [name]  has presented today for surgery, with the diagnosis of BROKEN LAP BAND TUBING   The various methods of treatment have been discussed with the patient and family.   Sister, Alejandro Mulling, with patient.  After consideration of risks, benefits and other options for treatment, the patient has consented to  Procedure(s): LAPAROSCOPIC REMOVAL OF GASTRIC BANDING (N/A) as a surgical intervention .  The patient's history has been reviewed, patient examined, no change in status, stable for surgery.  I have reviewed the patient's chart and labs.  Questions were answered to the patient's satisfaction.     Janeann Paisley H

## 2015-10-18 NOTE — Anesthesia Postprocedure Evaluation (Signed)
Anesthesia Post Note  Patient: [name]  Procedure(s) Performed: Procedure(s) (LRB): LAPAROSCOPIC REMOVAL OF GASTRIC BANDING (N/A)  Patient location during evaluation: PACU Anesthesia Type: General Pain management: pain level controlled Vital Signs Assessment: post-procedure vital signs reviewed and stable Respiratory status: spontaneous breathing Cardiovascular status: stable Anesthetic complications: no    Last Vitals:  Filed Vitals:   10/18/15 1715 10/18/15 1730  BP: 161/86 160/84  Pulse: 69 63  Temp:  36.6 C  Resp: 14 13    Last Pain:  Filed Vitals:   10/18/15 1734  PainSc: 1                  EDWARDS,Kayvan Hoefling

## 2015-10-18 NOTE — Anesthesia Procedure Notes (Signed)
Procedure Name: Intubation Date/Time: 10/18/2015 3:36 PM Performed by: Lollie Sails Pre-anesthesia Checklist: Patient identified, Emergency Drugs available, Suction available, Patient being monitored and Timeout performed Patient Re-evaluated:Patient Re-evaluated prior to inductionOxygen Delivery Method: Circle system utilized Preoxygenation: Pre-oxygenation with 100% oxygen Intubation Type: IV induction Ventilation: Mask ventilation without difficulty Laryngoscope Size: Miller and 3 Grade View: Grade I Tube type: Oral Tube size: 7.5 mm Number of attempts: 1 Airway Equipment and Method: Stylet Placement Confirmation: ETT inserted through vocal cords under direct vision,  positive ETCO2 and breath sounds checked- equal and bilateral Secured at: 22 cm Tube secured with: Tape Dental Injury: Teeth and Oropharynx as per pre-operative assessment

## 2015-10-19 DIAGNOSIS — K9509 Other complications of gastric band procedure: Secondary | ICD-10-CM | POA: Diagnosis not present

## 2015-10-19 NOTE — Discharge Summary (Signed)
Physician Discharge Summary  Patient ID:  [name]  MRN: [id number]  DOB/AGE: 1961/02/23 55 y.o.  Admit date: 10/18/2015 Discharge date: 10/19/2015  Discharge Diagnoses:  1. 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 Meenakshi Sazama 3. MORBID OBESITY WITH BMI OF 45.0-49.9, ADULT (E66.01) 4. LOWER ABDOMINAL PAIN OF UNKNOWN ETIOLOGY (R10.30)  5. History of bilateral mastectomies/bilateral breast cancer - 2008 Left side - T2, N1a, right - T1b, N1a Was seeing Thomasene Ripple for oncology. Dr. Towanda Malkin placed bilateral tissue expanders last year. 6. BRCA 2 postivie 7. Hypertension 8. Bilateral knee replacement - 2010 - M. Alvan Dame Has not seen Dr. Alvan Dame in a while 9. History of back issues. - She has not seen Dr. Nelva Bush in a while 10. Rheumatoid arthritis Sees Dr. Lenna Gilford On methotrexate Her primary complaint is her right foot 11. On chronic disability since 2009 12.  Small hernia (1 cm) in left upper quadrant and adhesions in pelvis from prior pelvic surgery   Active Problems:   History of removal of laparoscopic gastric banding device   Operation: Procedure(s): LAPAROSCOPIC REMOVAL OF GASTRIC BANDING on 10/18/2015 - D. Lucia Gaskins  Discharged Condition: good  Hospital Course: [name] is an 55 y.o. female whose primary care physician is MCNEILL,WENDY, MD and who was admitted 10/18/2015 with a chief complaint of No chief complaint on file. Marland Kitchen   She was brought to the operating room on 10/18/2015 and underwent  LAPAROSCOPIC REMOVAL OF GASTRIC BANDING.  She is now one day post op.  Her "bladder" pain is better.  We'll have to see how she does with her other complaints, but early, she seems better.   The discharge instructions were reviewed with the patient.  Consults:  None  Significant Diagnostic Studies: Results for orders placed or performed during the hospital encounter of 10/18/15  Glucose, capillary  Result Value Ref Range   Glucose-Capillary 84 65 - 99 mg/dL   Comment 1 Notify RN   Glucose, capillary  Result Value Ref Range   Glucose-Capillary 93 65 - 99 mg/dL    Ct Abdomen Pelvis W Contrast  10/11/2015  CLINICAL DATA:  Acute onset of generalized abdominal pain. Recently diagnosed disconnected catheter tubing at the patient's gastric band. Initial encounter. EXAM: CT ABDOMEN AND PELVIS WITH CONTRAST TECHNIQUE: Multidetector CT imaging of the abdomen and pelvis was performed using the standard protocol following bolus administration of intravenous contrast. CONTRAST:  13m OMNIPAQUE IOHEXOL 300 MG/ML  SOLN COMPARISON:  CT of the abdomen and pelvis performed 09/26/2015 FINDINGS: The visualized lung bases are clear. Note is again made of disconnected tubing of the patient's gastric band, with the disconnected tube extending into the pelvis. The liver and spleen are unremarkable in appearance. The gallbladder is within normal limits. The pancreas and adrenal glands are unremarkable. There is incomplete rotation of the right kidney. The kidneys are otherwise unremarkable. There is no evidence of hydronephrosis. No renal or ureteral stones are seen. No perinephric stranding is appreciated. The small bowel is unremarkable in appearance. The stomach is within normal limits. No acute vascular abnormalities are seen. The appendix is normal in caliber, without evidence of appendicitis. Trace fluid about the right lower quadrant is nonspecific. The colon is unremarkable in appearance. The bladder is mildly distended and grossly unremarkable. The patient is status post hysterectomy. No suspicious adnexal masses are seen. No inguinal lymphadenopathy is seen. No acute osseous abnormalities are identified. Mild  facet disease is noted at the lower lumbar spine. IMPRESSION: 1.  Note again made of disconnected tubing of the patient's gastric band, with the descending tube extending into the pelvis. 2. Trace nonspecific fluid at the right lower quadrant. The appendix is unremarkable in appearance. Electronically Signed   By: Garald Balding M.D.   On: 10/11/2015 02:10   Ct Abdomen Pelvis W Contrast  09/26/2015  CLINICAL DATA:  Patient with history of acute onset lower abdominal pain while sitting in church. No nausea, vomiting or diarrhea. History lap band procedure. EXAM: CT ABDOMEN AND PELVIS WITH CONTRAST TECHNIQUE: Multidetector CT imaging of the abdomen and pelvis was performed using the standard protocol following bolus administration of intravenous contrast. CONTRAST:  127m OMNIPAQUE IOHEXOL 300 MG/ML SOLN, 231mOMNIPAQUE IOHEXOL 300 MG/ML SOLN COMPARISON:  Abdominal radiograph 08/05/2008 FINDINGS: Lower chest: Minimal dependent atelectasis. Heart is enlarged. Small hiatal hernia. Distal esophageal wall thickening. Hepatobiliary: Liver is normal in size and contour. Too small to characterize sub cm low-attenuation lesion within the left hepatic lobe and central liver (image 22; series 3) and (image 8; series 3). Gallbladder is decompressed. No intrahepatic or extrahepatic biliary ductal dilatation. Pancreas: Unremarkable Spleen: Unremarkable Adrenals/Urinary Tract: Adrenal glands are normal. Kidneys enhance symmetrically with contrast. No hydronephrosis. Stomach/Bowel: No evidence for bowel obstruction. Normal morphology to the stomach. Trace fluid in the pelvis. No free intraperitoneal air. Vascular/Lymphatic: Normal caliber abdominal aorta. No retroperitoneal lymphadenopathy. Other: Gastric band is present. Port is demonstrated within the subcutaneous fat of the right aspect of the anterior abdominal wall (image 40; series 3). The tubing from the port to the gastric band has fractured. Tubing courses into the left aspect of the abdomen and terminates within the pelvis (image 79;  series 3). This is not contiguous with gastric band and distal aspect of the tubing. Musculoskeletal: Lumbar spine degenerative changes. No aggressive or acute appearing osseous lesions. IMPRESSION: Patient has gastric band in place. The catheter tubing has become disconnected within the mid aspect with the proximal portion of the tubing terminating within the pelvis. The gastric band appears in place around the stomach with a small amount of distal tubing still attached. Wall thickening of the distal esophagus, potentially secondary to esophagitis. Consider clinical correlation and possible direct visualization as clinically indicated. These results were called by telephone at the time of interpretation on 09/26/2015 at 2:30 pm toStevie Barrett, PA , who verbally acknowledged these results. Electronically Signed   By: DrLovey Newcomer.D.   On: 09/26/2015 14:37    Discharge Exam:  Filed Vitals:   10/18/15 2149 10/19/15 0600  BP: 170/85 143/72  Pulse: 58 53  Temp: 97.5 F (36.4 C) 97.9 F (36.6 C)  Resp: 16     General: Obese AA F who is alert and generally healthy appearing.  Lungs: Clear to auscultation and symmetric breath sounds. Heart:  RRR. No murmur or rub. Abdomen: Soft.  Normal bowel sounds.  Incisions looks good.  Discharge Medications:     Medication List    STOP taking these medications        cephALEXin 500 MG capsule  Commonly known as:  KEFLEX      TAKE these medications        CALCIUM 600 + D PO  Take 1 tablet by mouth daily.     cetirizine 10 MG tablet  Commonly known as:  ZYRTEC  Take 10 mg by mouth daily.     folic acid 1 MG tablet  Commonly known  as:  FOLVITE  Take 1 mg by mouth daily.     hydrochlorothiazide 25 MG tablet  Commonly known as:  HYDRODIURIL  Take 25 mg by mouth daily.     HYDROcodone-acetaminophen 5-325 MG tablet  Commonly known as:  NORCO/VICODIN  Take 1-2 tablets by mouth every 4 (four) hours as needed.     hydroxychloroquine 200 MG  tablet  Commonly known as:  PLAQUENIL  Take 200 mg by mouth daily.     losartan 50 MG tablet  Commonly known as:  COZAAR  Take 50 mg by mouth daily.     metFORMIN 500 MG 24 hr tablet  Commonly known as:  GLUCOPHAGE-XR  Take 500 mg by mouth every evening.     methotrexate 2.5 MG tablet  Commonly known as:  RHEUMATREX  Take 25 mg by mouth once a week.     PRILOSEC PO  Take 20 mg by mouth daily as needed (for acid reflux/heartburn).        Disposition: 01-Home or Self Care      Discharge Instructions    Diet - low sodium heart healthy    Complete by:  As directed      Increase activity slowly    Complete by:  As directed             Activity:  Driving - May drive in 3 or 4 days, if doing well   Lifting - No lifting more than 15 pounds for 7 days, then no limit  Wound Care:   May shower starting tomorrow  Diet:  As tolerated  Follow up appointment:  Call Dr. Pollie Friar office [facility]) at [phone] for an appointment in 2 to 3 weeks.  Medications and dosages:  Resume your home medications.   Signed: Alphonsa Overall, M.D., [facility] Office:  [phone]  10/19/2015, 9:22 AM

## 2015-10-19 NOTE — Progress Notes (Signed)
DC instructions reviewed, alls questions and concerns were addressed. Pt is alert and oriented times four, VSS, incision sites unremarkable, all other skin intact. Sister transporting home.

## 2015-10-19 NOTE — Discharge Instructions (Signed)
[facility] - DISCHARGE INSTRUCTIONS TO PATIENT  Activity:  Driving - May drive in 3 or 4 days, if doing well   Lifting - No lifting more than 15 pounds for 7 days, then no limit  Wound Care:   May shower starting tomorrow  Diet:  As tolerated  Follow up appointment:  Call Dr. Pollie Friar office [facility]) at [phone] for an appointment in 2 to 3 weeks.  Medications and dosages:  Resume your home medications.   Call Dr. Lucia Gaskins or his office  [phone]) if you have:  Temperature greater than 100.4,  Persistent nausea and vomiting,  Severe uncontrolled pain,  Redness, tenderness, or signs of infection (pain, swelling, redness, odor or green/yellow discharge around the site),  Difficulty breathing, headache or visual disturbances,  Any other questions or concerns you may have after discharge.  In an emergency, call 911 or go to an Emergency Department at a nearby hospital.

## 2017-01-23 IMAGING — CT CT ABD-PELV W/ CM
2 of 5 series · 16 of 46 positions shown, 18 images · IV contrast (100 ML OMNI 300)
Comparison: CT of the abdomen and pelvis performed 09/26/2015

CLINICAL DATA: Acute onset of generalized abdominal pain. Recently
diagnosed disconnected catheter tubing at the patient's gastric
band. Initial encounter.

EXAM:
CT ABDOMEN AND PELVIS WITH CONTRAST
TECHNIQUE: Multidetector CT imaging of the abdomen and pelvis was performed
using the standard protocol following bolus administration of
intravenous contrast.
CONTRAST:  100mL OMNIPAQUE IOHEXOL 300 MG/ML  SOLN

[Series 2: abd/pel with · axial · 0.74mm/px · z∈[+114,+498]mm · 13 of 87 slices shown, 15 images]
[im 5/87  soft-tissue]
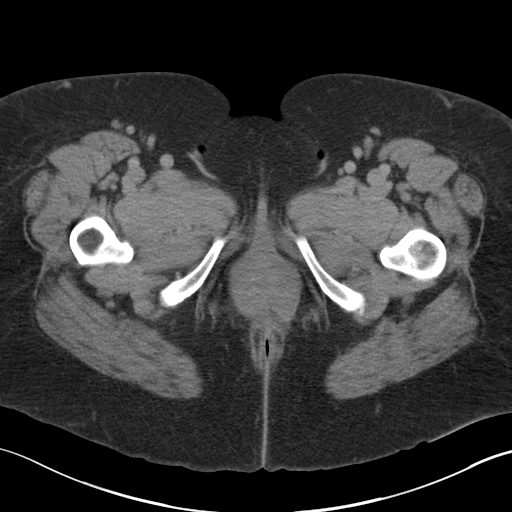
[im 5/87  bone]
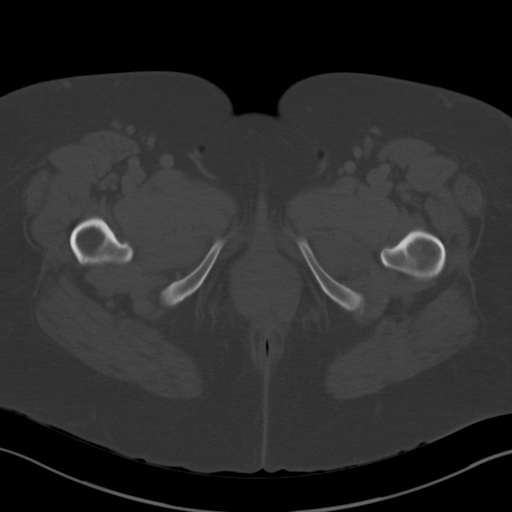
[im 14/87  soft-tissue]
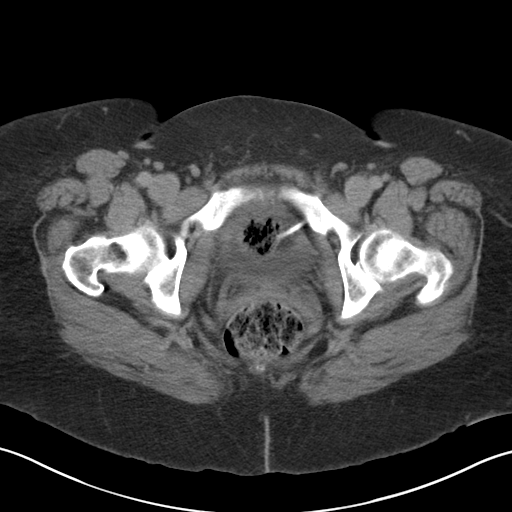
[im 19/87  soft-tissue]
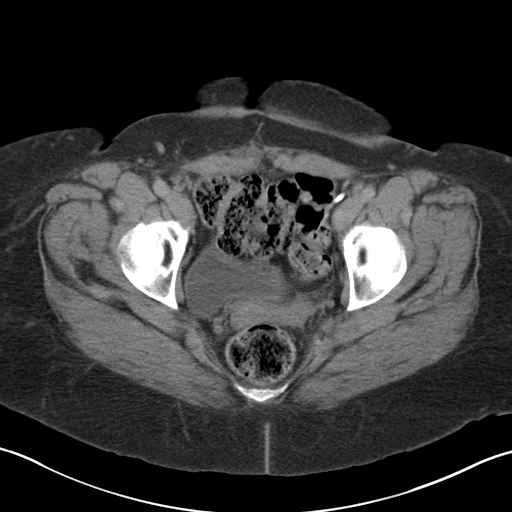
[im 23/87  soft-tissue]
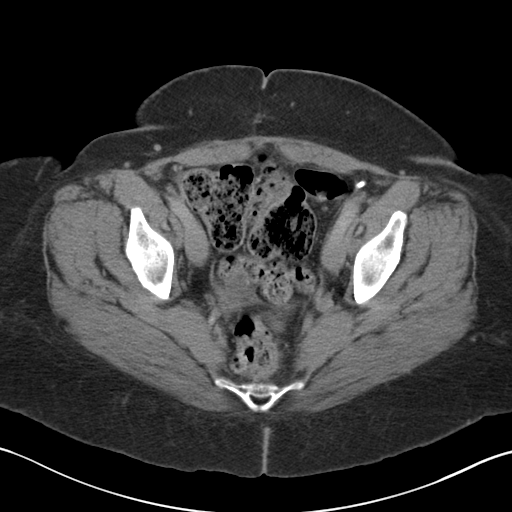
[im 32/87  soft-tissue]
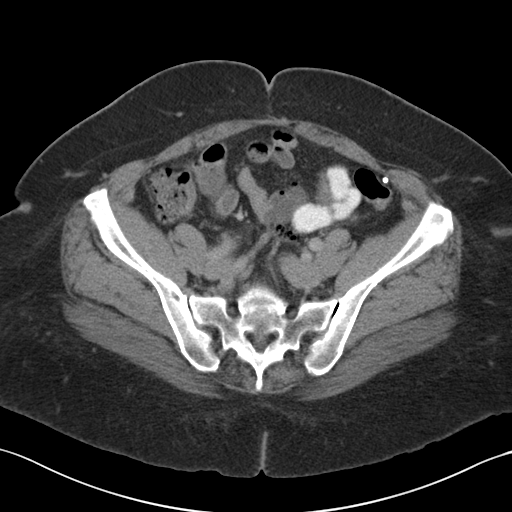
[im 37/87  soft-tissue]
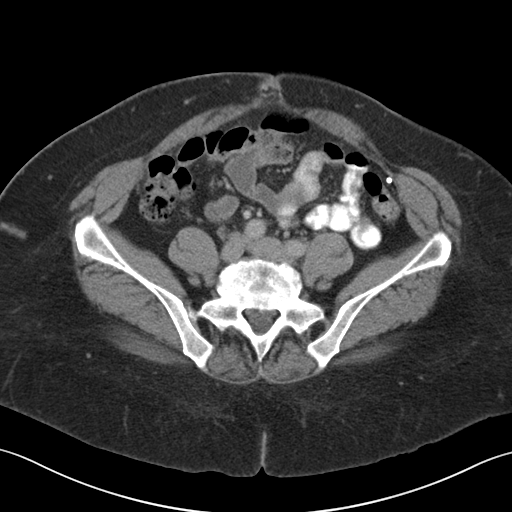
[im 46/87  soft-tissue]
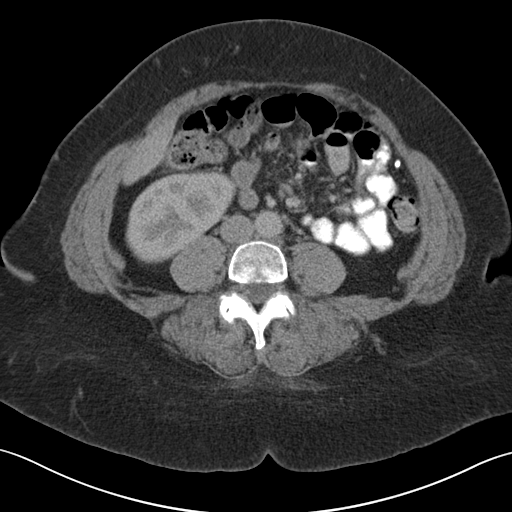
[im 50/87  soft-tissue]
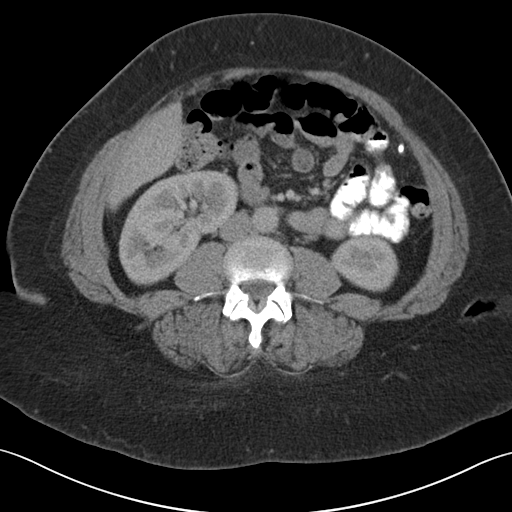
[im 55/87  soft-tissue]
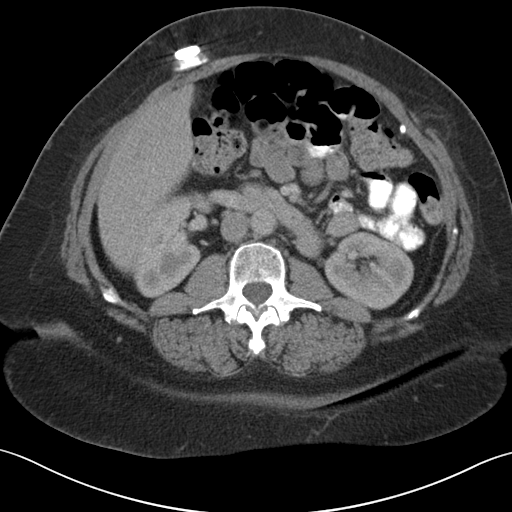
[im 55/87  bone]
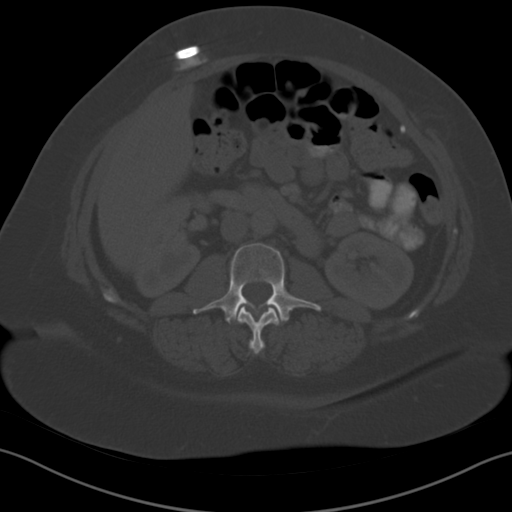
[im 64/87  soft-tissue]
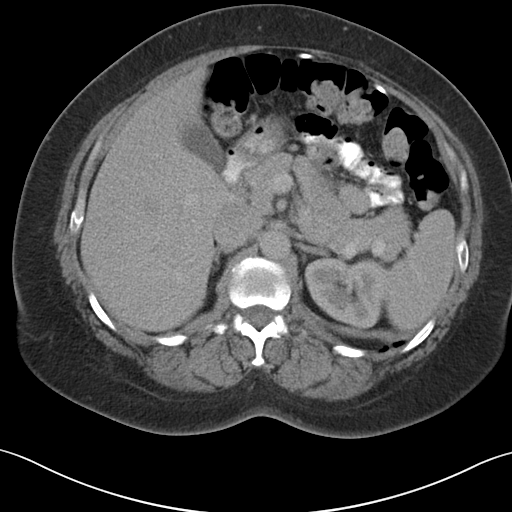
[im 68/87  soft-tissue]
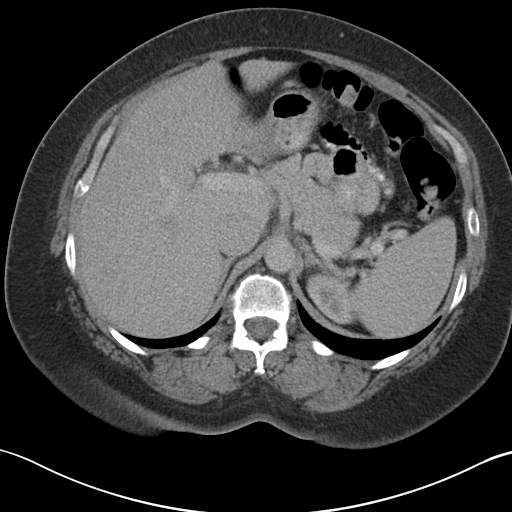
[im 73/87  soft-tissue]
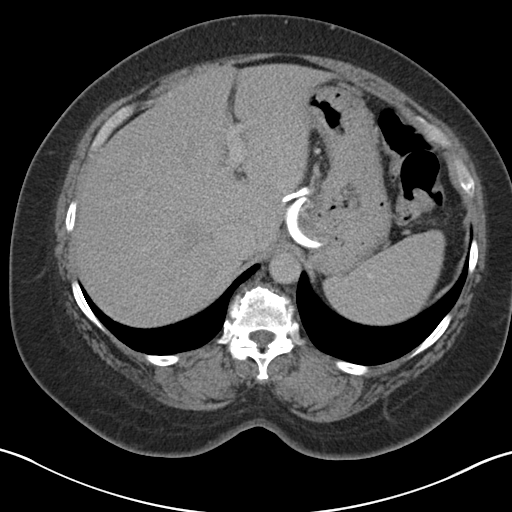
[im 82/87  soft-tissue]
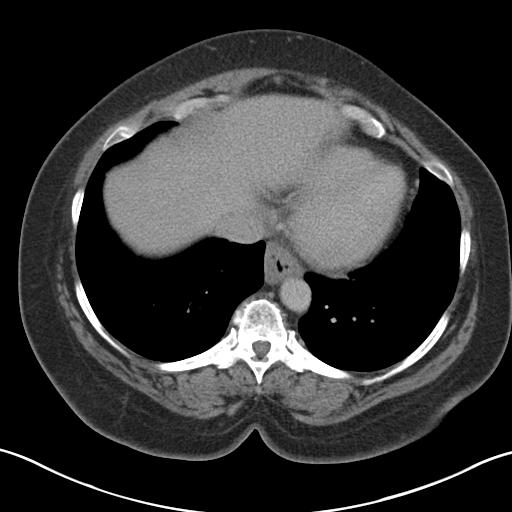

[Series 4: coronal a/|p · coronal · 0.71mm/px · 3 of 97 slices shown]
[im 33/97  soft-tissue]
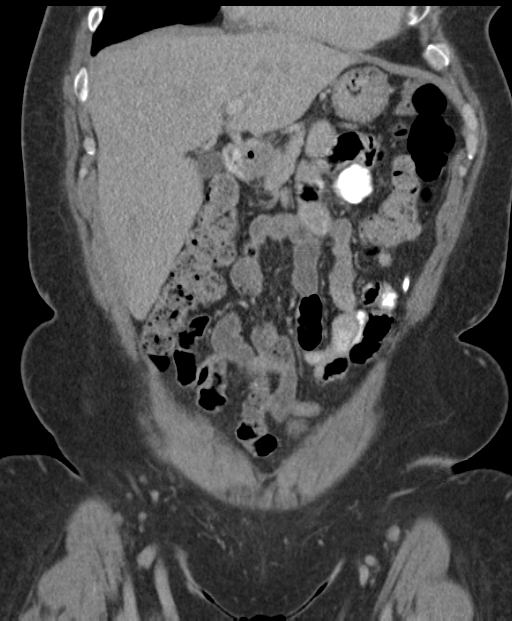
[im 43/97  soft-tissue]
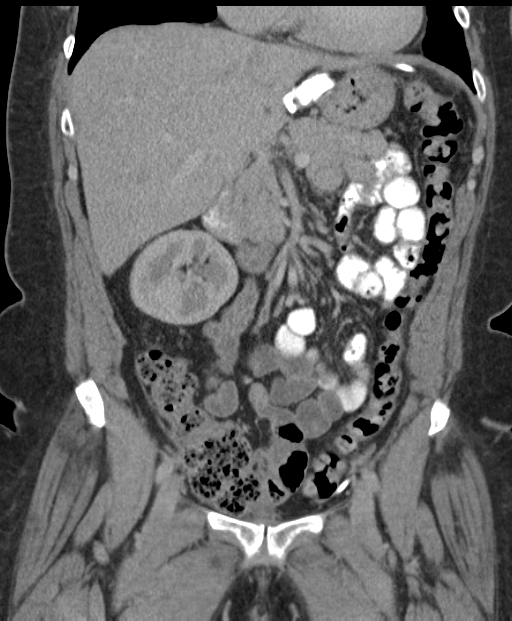
[im 54/97  soft-tissue]
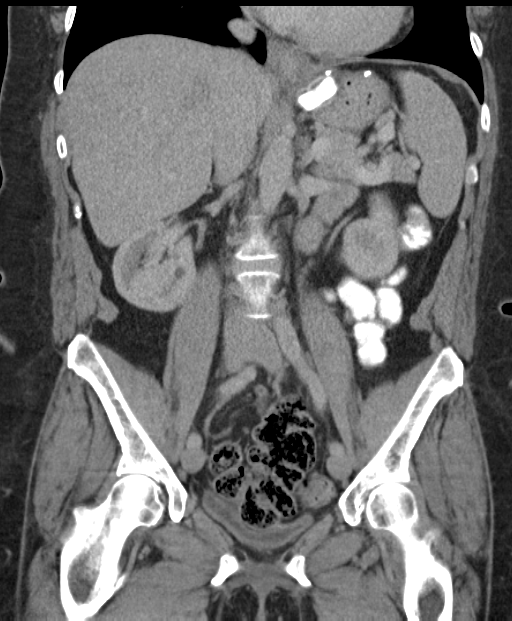

[16 of 46 positions shown; findings below may reference images not displayed]

FINDINGS: The visualized lung bases are clear.

Note is again made of disconnected tubing of the patient's gastric
band, with the disconnected tube extending into the pelvis.

The liver and spleen are unremarkable in appearance. The gallbladder
is within normal limits. The pancreas and adrenal glands are
unremarkable.

There is incomplete rotation of the right kidney. The kidneys are
otherwise unremarkable. There is no evidence of hydronephrosis. No
renal or ureteral stones are seen. No perinephric stranding is
appreciated.

The small bowel is unremarkable in appearance. The stomach is within
normal limits. No acute vascular abnormalities are seen.

The appendix is normal in caliber, without evidence of appendicitis.
Trace fluid about the right lower quadrant is nonspecific.

The colon is unremarkable in appearance.

The bladder is mildly distended and grossly unremarkable. The
patient is status post hysterectomy. No suspicious adnexal masses
are seen. No inguinal lymphadenopathy is seen.

No acute osseous abnormalities are identified. Mild facet disease is
noted at the lower lumbar spine.
IMPRESSION: 1. Note again made of disconnected tubing of the patient's gastric
band, with the descending tube extending into the pelvis.
2. Trace nonspecific fluid at the right lower quadrant. The appendix
is unremarkable in appearance.

## 2018-02-25 ENCOUNTER — Other Ambulatory Visit (HOSPITAL_COMMUNITY): Payer: Self-pay | Admitting: Surgery

## 2018-02-25 DIAGNOSIS — E213 Hyperparathyroidism, unspecified: Secondary | ICD-10-CM

## 2018-03-04 ENCOUNTER — Other Ambulatory Visit (HOSPITAL_COMMUNITY): Payer: Self-pay | Admitting: Surgery

## 2018-03-04 DIAGNOSIS — E213 Hyperparathyroidism, unspecified: Secondary | ICD-10-CM

## 2018-03-06 ENCOUNTER — Encounter (HOSPITAL_COMMUNITY)
Admission: RE | Admit: 2018-03-06 | Discharge: 2018-03-06 | Disposition: A | Discharge status: Home / Self Care | Payer: Medicare Other | Source: Ambulatory Visit | Attending: Surgery | Admitting: Surgery

## 2018-03-06 ENCOUNTER — Ambulatory Visit (HOSPITAL_COMMUNITY)
Admission: RE | Admit: 2018-03-06 | Discharge: 2018-03-06 | Disposition: A | Discharge status: Home / Self Care | Payer: Medicare Other | Source: Ambulatory Visit | Attending: Surgery | Admitting: Surgery

## 2018-03-06 DIAGNOSIS — E213 Hyperparathyroidism, unspecified: Secondary | ICD-10-CM | POA: Insufficient documentation

## 2018-03-06 MED ORDER — TECHNETIUM TC 99M SESTAMIBI - CARDIOLITE
23.0000 | Freq: Once | INTRAVENOUS | Status: AC | PRN
  Administered 2018-03-06: 12:00:00 23 via INTRAVENOUS

## 2018-04-03 ENCOUNTER — Other Ambulatory Visit: Payer: Self-pay | Admitting: Surgery

## 2018-04-03 DIAGNOSIS — E210 Primary hyperparathyroidism: Secondary | ICD-10-CM

## 2018-04-15 ENCOUNTER — Ambulatory Visit
Admission: RE | Admit: 2018-04-15 | Discharge: 2018-04-15 | Disposition: A | Discharge status: Home / Self Care | Payer: Medicare Other | Source: Ambulatory Visit | Attending: Surgery | Admitting: Surgery

## 2018-04-15 DIAGNOSIS — E210 Primary hyperparathyroidism: Secondary | ICD-10-CM

## 2018-04-15 MED ORDER — IOPAMIDOL (ISOVUE-370) INJECTION 76%
100.0000 mL | Freq: Once | INTRAVENOUS | Status: AC | PRN
  Administered 2018-04-15: 100 mL via INTRAVENOUS

## 2018-04-26 ENCOUNTER — Ambulatory Visit: Payer: Self-pay | Admitting: Surgery

## 2018-05-14 NOTE — Patient Instructions (Signed)
[name]  05/14/2018   Your procedure is scheduled on: 05-17-18   Report to [facility] Main  Entrance    Report to admitting at 5:30AM    Call this number if you have problems the morning of surgery [phone]     Remember: Do not eat food or drink liquids :After Midnight.     Take these medicines the morning of surgery with A SIP OF WATER: zyrtec                                 You may not have any metal on your body including hair pins and              piercings  Do not wear jewelry, make-up, lotions, powders or perfumes, deodorant             Do not wear nail polish.  Do not shave  48 hours prior to surgery.     Do not bring valuables to the hospital. [place].  Contacts, dentures or bridgework may not be worn into surgery.  Leave suitcase in the car. After surgery it may be brought to your room.                Please read over the following fact sheets you were given: _____________________________________________________________________             [facility] - Preparing for Surgery Before surgery, you can play an important role.  Because skin is not sterile, your skin needs to be as free of germs as possible.  You can reduce the number of germs on your skin by washing with CHG (chlorahexidine gluconate) soap before surgery.  CHG is an antiseptic cleaner which kills germs and bonds with the skin to continue killing germs even after washing. Please DO NOT use if you have an allergy to CHG or antibacterial soaps.  If your skin becomes reddened/irritated stop using the CHG and inform your nurse when you arrive at Short Stay. Do not shave (including legs and underarms) for at least 48 hours prior to the first CHG shower.  You may shave your face/neck. Please follow these instructions carefully:  1.  Shower with CHG Soap the night before surgery and the  morning of Surgery.  2.  If you choose  to wash your hair, wash your hair first as usual with your  normal  shampoo.  3.  After you shampoo, rinse your hair and body thoroughly to remove the  shampoo.                           4.  Use CHG as you would any other liquid soap.  You can apply chg directly  to the skin and wash                       Gently with a scrungie or clean washcloth.  5.  Apply the CHG Soap to your body ONLY FROM THE NECK DOWN.   Do not use on face/ open  Wound or open sores. Avoid contact with eyes, ears mouth and genitals (private parts).                       Wash face,  Genitals (private parts) with your normal soap.             6.  Wash thoroughly, paying special attention to the area where your surgery  will be performed.  7.  Thoroughly rinse your body with warm water from the neck down.  8.  DO NOT shower/wash with your normal soap after using and rinsing off  the CHG Soap.                9.  Pat yourself dry with a clean towel.            10.  Wear clean pajamas.            11.  Place clean sheets on your bed the night of your first shower and do not  sleep with pets. Day of Surgery : Do not apply any lotions/deodorants the morning of surgery.  Please wear clean clothes to the hospital/surgery center.  FAILURE TO FOLLOW THESE INSTRUCTIONS MAY RESULT IN THE CANCELLATION OF YOUR SURGERY PATIENT SIGNATURE_________________________________  NURSE SIGNATURE__________________________________  ________________________________________________________________________

## 2018-05-15 ENCOUNTER — Inpatient Hospital Stay (HOSPITAL_COMMUNITY)
Admission: RE | Admit: 2018-05-15 | Discharge: 2018-05-15 | Disposition: A | Discharge status: Home / Self Care | Payer: Self-pay | Source: Ambulatory Visit

## 2018-05-15 ENCOUNTER — Encounter (HOSPITAL_COMMUNITY)
Admission: RE | Admit: 2018-05-15 | Discharge: 2018-05-15 | Disposition: A | Discharge status: Home / Self Care | Payer: Medicare Other | Source: Ambulatory Visit | Attending: Surgery | Admitting: Surgery

## 2018-05-15 ENCOUNTER — Other Ambulatory Visit: Payer: Self-pay

## 2018-05-15 ENCOUNTER — Encounter (HOSPITAL_COMMUNITY): Payer: Self-pay

## 2018-05-15 DIAGNOSIS — E119 Type 2 diabetes mellitus without complications: Secondary | ICD-10-CM | POA: Diagnosis not present

## 2018-05-15 DIAGNOSIS — D351 Benign neoplasm of parathyroid gland: Secondary | ICD-10-CM | POA: Diagnosis not present

## 2018-05-15 DIAGNOSIS — Z7984 Long term (current) use of oral hypoglycemic drugs: Secondary | ICD-10-CM | POA: Diagnosis not present

## 2018-05-15 DIAGNOSIS — Z853 Personal history of malignant neoplasm of breast: Secondary | ICD-10-CM | POA: Diagnosis not present

## 2018-05-15 DIAGNOSIS — G4730 Sleep apnea, unspecified: Secondary | ICD-10-CM | POA: Diagnosis not present

## 2018-05-15 DIAGNOSIS — I10 Essential (primary) hypertension: Secondary | ICD-10-CM | POA: Diagnosis not present

## 2018-05-15 DIAGNOSIS — Z791 Long term (current) use of non-steroidal anti-inflammatories (NSAID): Secondary | ICD-10-CM | POA: Diagnosis not present

## 2018-05-15 DIAGNOSIS — K219 Gastro-esophageal reflux disease without esophagitis: Secondary | ICD-10-CM | POA: Diagnosis not present

## 2018-05-15 DIAGNOSIS — Z79899 Other long term (current) drug therapy: Secondary | ICD-10-CM | POA: Diagnosis not present

## 2018-05-15 DIAGNOSIS — E210 Primary hyperparathyroidism: Secondary | ICD-10-CM | POA: Diagnosis present

## 2018-05-15 HISTORY — DX: Prediabetes: R73.03

## 2018-05-15 HISTORY — DX: Sleep apnea, unspecified: G47.30

## 2018-05-15 LAB — BASIC METABOLIC PANEL
Anion gap: 8 (ref 5–15)
BUN: 17 mg/dL (ref 6–20)
CHLORIDE: 107 mmol/L (ref 98–111)
CO2: 28 mmol/L (ref 22–32)
Calcium: 11 mg/dL — ABNORMAL HIGH (ref 8.9–10.3)
Creatinine, Ser: 0.69 mg/dL (ref 0.44–1.00)
GFR calc non Af Amer: >60 mL/min (ref >60)
Glucose, Bld: 103 mg/dL — ABNORMAL HIGH (ref 70–99)
POTASSIUM: 3.7 mmol/L (ref 3.5–5.1)
SODIUM: 143 mmol/L (ref 135–145)

## 2018-05-15 LAB — CBC
HEMATOCRIT: 38.2 % (ref 36.0–46.0)
Hemoglobin: 12 g/dL (ref 12.0–15.0)
MCH: 28.4 pg (ref 26.0–34.0)
MCHC: 31.4 g/dL (ref 30.0–36.0)
MCV: 90.3 fL (ref 78.0–100.0)
Platelets: 259 10*3/uL (ref 150–400)
RBC: 4.23 MIL/uL (ref 3.87–5.11)
RDW: 16.2 % — ABNORMAL HIGH (ref 11.5–15.5)
WBC: 7 10*3/uL (ref 4.0–10.5)

## 2018-05-15 LAB — HEMOGLOBIN A1C
Hgb A1c MFr Bld: 5.7 % — ABNORMAL HIGH (ref 4.8–5.6)
Mean Plasma Glucose: 116.89 mg/dL

## 2018-05-15 NOTE — Progress Notes (Signed)
Chart left with [name] for follow up

## 2018-05-15 NOTE — Patient Instructions (Signed)
[name]  05/15/2018   Your procedure is scheduled on: 05-17-18  Report to [facility] Main  Entrance  Report to admitting at     [facility] AM    Call this number if you have problems the morning of surgery [phone]   Remember: Do not eat food or drink liquids :After Midnight.     Take these medicines the morning of surgery with A SIP OF WATER: cetrizine and omeprazole DO NOT TAKE ANY DIABETIC MEDICATIONS DAY OF YOUR SURGERY                               You may not have any metal on your body including hair pins and              piercings  Do not wear jewelry, make-up, lotions, powders or perfumes, deodorant             Do not wear nail polish.  Do not shave  48 hours prior to surgery.              Do not bring valuables to the hospital. [place].  Contacts, dentures or bridgework may not be worn into surgery.  Leave suitcase in the car. After surgery it may be brought to your room.                  Please read over the following fact sheets you were given: _____________________________________________________________________           [facility] - Preparing for Surgery Before surgery, you can play an important role.  Because skin is not sterile, your skin needs to be as free of germs as possible.  You can reduce the number of germs on your skin by washing with CHG (chlorahexidine gluconate) soap before surgery.  CHG is an antiseptic cleaner which kills germs and bonds with the skin to continue killing germs even after washing. Please DO NOT use if you have an allergy to CHG or antibacterial soaps.  If your skin becomes reddened/irritated stop using the CHG and inform your nurse when you arrive at Short Stay. Do not shave (including legs and underarms) for at least 48 hours prior to the first CHG shower.  You may shave your face/neck. Please follow these instructions carefully:  1.  Shower with CHG  Soap the night before surgery and the  morning of Surgery.  2.  If you choose to wash your hair, wash your hair first as usual with your  normal  shampoo.  3.  After you shampoo, rinse your hair and body thoroughly to remove the  shampoo.                           4.  Use CHG as you would any other liquid soap.  You can apply chg directly  to the skin and wash                       Gently with a scrungie or clean washcloth.  5.  Apply the CHG Soap to your body ONLY FROM THE NECK DOWN.   Do not use on face/ open  Wound or open sores. Avoid contact with eyes, ears mouth and genitals (private parts).                       Wash face,  Genitals (private parts) with your normal soap.             6.  Wash thoroughly, paying special attention to the area where your surgery  will be performed.  7.  Thoroughly rinse your body with warm water from the neck down.  8.  DO NOT shower/wash with your normal soap after using and rinsing off  the CHG Soap.                9.  Pat yourself dry with a clean towel.            10.  Wear clean pajamas.            11.  Place clean sheets on your bed the night of your first shower and do not  sleep with pets. Day of Surgery : Do not apply any lotions/deodorants the morning of surgery.  Please wear clean clothes to the hospital/surgery center.  FAILURE TO FOLLOW THESE INSTRUCTIONS MAY RESULT IN THE CANCELLATION OF YOUR SURGERY PATIENT SIGNATURE_________________________________  NURSE SIGNATURE__________________________________  ________________________________________________________________________

## 2018-05-15 NOTE — Progress Notes (Signed)
CT neck 04-15-18 epic

## 2018-05-16 ENCOUNTER — Encounter (HOSPITAL_COMMUNITY): Payer: Self-pay | Admitting: Surgery

## 2018-05-16 DIAGNOSIS — E210 Primary hyperparathyroidism: Secondary | ICD-10-CM | POA: Diagnosis present

## 2018-05-16 MED ORDER — DEXTROSE 5 % IV SOLN
3.0000 g | INTRAVENOUS | Status: AC
  Administered 2018-05-17: 3 g via INTRAVENOUS
  Filled 2018-05-16: qty 3

## 2018-05-16 NOTE — H&P (Signed)
General Surgery [facility]  [name] DOB: [birth year] Divorced / Language: [name] / Race: Black or African American Female   History of Present Illness   The patient is a 57 year old female who presents with primary hyperparathyroidism.  CHIEF COMPLAINT: primary hyperparathyroidism  Patient is referred by Dr. Alphonsa Duffy for evaluation and management of suspected primary hyperparathyroidism. Patient's primary care physician is Dr. Cari Duffy. Patient had been noted on routine laboratory studies to have an elevated calcium level. Most recent laboratory studies provided include a calcium level of 10.6 and an intact PTH level of 92. 24-hour urine collection was markedly elevated at 594. Patient was seen and evaluated by my partner, Dr. Alphonsa Duffy. She underwent nuclear medicine parathyroid scan which was suggestive of a left inferior parathyroid adenoma but not felt to be definitive. Subsequent ultrasound of the neck was obtained which showed no thyroid nodules and no evidence of parathyroid adenoma. Patient complains of chronic fatigue. She complains of bone and joint pain. She denies nephrolithiasis. She has had no prior head or neck surgery. There is no family history of parathyroid disease. There is a history of thyroid disease in the patient's brother who takes thyroid medication. Patient denies tremors or palpitations.   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 Lap Band  Mastectomy  Bilateral.  Diagnostic Studies History Colonoscopy  within last year Pap Smear  1-5 years ago  Allergies Lotensin *ANTIHYPERTENSIVES*  Cough. Allergies Reconciled  No Known Drug Allergies  Medication History Amoxicillin-Pot Clavulanate (875-125MG  Tablet, Oral) Active. Penlac (8% Solution, External) Active. Pantoprazole Sodium (40MG  Tablet DR, Oral) Active. Turmeric (450MG  Capsule, Oral) Active. Tylenol Arthritis Ext Relief (650MG  Tablet ER, Oral) Active. Ibuprofen (800MG  Tablet, Oral) Active. Tums (500MG  Tablet Chewable, Oral) Active. Vitamin D3 (2000UNIT Capsule, Oral) Active. Vitamin E (400UNIT Capsule, Oral) Active. Multivitamin Adult (Oral) Active. Calcium Carbonate (600MG  Tablet, Oral) Active. ZyrTEC (10MG  Tablet, Oral) Active. PriLOSEC (10MG  Packet, Oral) Active. Losartan Potassium (50MG  Tablet, Oral) Active. HydroCHLOROthiazide (25MG  Tablet, Oral) Active. MetFORMIN HCl (1000MG  Tablet, Oral) Active. Methotrexate Sodium (2.5MG  Tablet, Oral) Active. Folic Acid (1MG  Tablet, Oral) Active. Medications Reconciled  Social History  Alcohol use  Occasional alcohol use. Caffeine use  Coffee. No drug use  Tobacco use  Never smoker.  Family History Breast Cancer  Family Members In General, Mother, Sister. Colon Cancer  Father. Hypertension  Father. Ovarian Cancer  Mother. Prostate Cancer  Father. Thyroid problems  Mother.  Pregnancy / Birth History  Gravida  2 Maternal age  61-20 Para  2  Other Problems Arthritis  Back Pain  Breast Cancer  Diabetes Mellitus  Gastroesophageal Reflux Disease  High blood pressure  Sleep Apnea  Transfusion history   Vitals Weight: 287.4 lb Height: 64in Body Surface Area: 2.28 m Body Mass Index: 49.33 kg/m  Temp.: 98.61F  Pulse: 89 (Regular)  BP:  146/82 (Sitting, Left Arm, Standard)   Physical Exam  See vital signs recorded above  GENERAL APPEARANCE  Development: normal Nutritional status: normal Gross deformities: none  SKIN Rash, lesions, ulcers: none Induration, erythema: none Nodules: none palpable  EYES Conjunctiva and lids: normal Pupils: equal and reactive Iris: normal bilaterally  EARS, NOSE, MOUTH, THROAT External ears: no lesion or deformity External nose: no lesion or deformity Hearing: grossly normal Lips: no lesion or deformity Dentition: normal for age Oral mucosa: moist  NECK Symmetric: yes Trachea: midline Thyroid: no palpable nodules in the thyroid bed  CHEST Respiratory effort: normal Retraction or accessory muscle use: no Breath sounds: normal bilaterally Rales, rhonchi, wheeze: none  CARDIOVASCULAR Auscultation: regular rhythm, normal rate Murmurs: none Pulses: carotid and radial pulse 2+ palpable Lower extremity edema: moderate Lower extremity varicosities: none  MUSCULOSKELETAL Station and gait: normal Digits and nails: no clubbing or cyanosis Muscle strength: grossly normal all extremities Range of motion: grossly normal all extremities Deformity: none  LYMPHATIC Cervical: none palpable Supraclavicular: none palpable  PSYCHIATRIC Oriented to person, place, and time: yes Mood and affect: normal for situation Judgment and insight: appropriate for situation    Assessment & Plan  PRIMARY HYPERPARATHYROIDISM (E21.0)  Pt Education - Pamphlet Given - The Parathyroid Surgery Book: discussed with patient and provided information.  Follow Up - Call [facility] office after tests / studies doneto discuss further plans  Patient presents today accompanied by her daughter and her brother. We reviewed her previous studies and laboratory studies together. I provided them with copies further records. Patient has a parathyroid surgery booklet which we provided to them.  Patient has  biochemical evidence of primary hyperparathyroidism. She has significant calcium wasting as indicated on her 24-hour urine collection. The nuclear medicine parathyroid scan is suggestive of a left inferior parathyroid adenoma. However this was not confirmed by ultrasound examination. I would like to confirm the presence of a parathyroid adenoma in the left inferior position with a 4D CT scan of the neck with parathyroid protocol. We discussed the study. We discussed minimally invasive parathyroid surgery performed as an outpatient. We discussed the traditional neck exploration with 4 gland identification requiring an overnight hospital stay. We discussed risk and benefits of the surgery including the potential for recurrent laryngeal nerve injury.  Patient would like to proceed with further testing in anticipation of surgery for parathyroidectomy. We will make arrangements for her CT scan in the immediate future. We will contact her with those results when they are available.  ADDENDUM:  4D CT scan negative for adenoma.  Plan neck exploration with parathyroidectomy.  The risks and benefits of the procedure have been discussed at length with the patient.  The patient understands the proposed procedure, potential alternative treatments, and the course of recovery to be expected.  All of the patient's questions have been answered at this time.  The patient wishes to proceed with surgery.  Armandina Gemma, [facility] Office: [phone]

## 2018-05-17 ENCOUNTER — Ambulatory Visit (HOSPITAL_COMMUNITY): Payer: Medicare Other | Admitting: Registered Nurse

## 2018-05-17 ENCOUNTER — Other Ambulatory Visit: Payer: Self-pay

## 2018-05-17 ENCOUNTER — Encounter (HOSPITAL_COMMUNITY)
Admission: RE | Disposition: A | Discharge status: Home / Self Care | Payer: Self-pay | Source: Ambulatory Visit | Attending: Surgery

## 2018-05-17 ENCOUNTER — Observation Stay (HOSPITAL_COMMUNITY)
Admission: RE | Admit: 2018-05-17 | Discharge: 2018-05-18 | Disposition: A | Discharge status: Home / Self Care | Payer: Medicare Other | Source: Ambulatory Visit | Attending: Surgery | Admitting: Surgery

## 2018-05-17 ENCOUNTER — Encounter (HOSPITAL_COMMUNITY): Payer: Self-pay | Admitting: *Deleted

## 2018-05-17 DIAGNOSIS — D351 Benign neoplasm of parathyroid gland: Secondary | ICD-10-CM | POA: Diagnosis not present

## 2018-05-17 DIAGNOSIS — K219 Gastro-esophageal reflux disease without esophagitis: Secondary | ICD-10-CM | POA: Diagnosis not present

## 2018-05-17 DIAGNOSIS — Z791 Long term (current) use of non-steroidal anti-inflammatories (NSAID): Secondary | ICD-10-CM | POA: Insufficient documentation

## 2018-05-17 DIAGNOSIS — Z79899 Other long term (current) drug therapy: Secondary | ICD-10-CM | POA: Insufficient documentation

## 2018-05-17 DIAGNOSIS — Z7984 Long term (current) use of oral hypoglycemic drugs: Secondary | ICD-10-CM | POA: Insufficient documentation

## 2018-05-17 DIAGNOSIS — I10 Essential (primary) hypertension: Secondary | ICD-10-CM | POA: Insufficient documentation

## 2018-05-17 DIAGNOSIS — Z853 Personal history of malignant neoplasm of breast: Secondary | ICD-10-CM | POA: Insufficient documentation

## 2018-05-17 DIAGNOSIS — E119 Type 2 diabetes mellitus without complications: Secondary | ICD-10-CM | POA: Insufficient documentation

## 2018-05-17 DIAGNOSIS — E210 Primary hyperparathyroidism: Secondary | ICD-10-CM | POA: Diagnosis not present

## 2018-05-17 DIAGNOSIS — Z01818 Encounter for other preprocedural examination: Secondary | ICD-10-CM

## 2018-05-17 DIAGNOSIS — G4730 Sleep apnea, unspecified: Secondary | ICD-10-CM | POA: Insufficient documentation

## 2018-05-17 HISTORY — PX: PARATHYROID EXPLORATION: SHX732

## 2018-05-17 LAB — GLUCOSE, CAPILLARY
GLUCOSE-CAPILLARY: 123 mg/dL — AB (ref 70–99)
Glucose-Capillary: 81 mg/dL (ref 70–99)

## 2018-05-17 SURGERY — EXPLORATION, PARATHYROID
Anesthesia: General

## 2018-05-17 MED ORDER — SUCCINYLCHOLINE CHLORIDE 200 MG/10ML IV SOSY
PREFILLED_SYRINGE | INTRAVENOUS | Status: DC | PRN
  Administered 2018-05-17: 120 mg via INTRAVENOUS

## 2018-05-17 MED ORDER — LABETALOL HCL 5 MG/ML IV SOLN
INTRAVENOUS | Status: AC
  Filled 2018-05-17: qty 4

## 2018-05-17 MED ORDER — HYDROCODONE-ACETAMINOPHEN 5-325 MG PO TABS: 1.0000 | ORAL_TABLET | ORAL | Status: DC | PRN

## 2018-05-17 MED ORDER — KCL IN DEXTROSE-NACL 20-5-0.45 MEQ/L-%-% IV SOLN
INTRAVENOUS | Status: DC
  Administered 2018-05-17: 11:00:00 via INTRAVENOUS
  Filled 2018-05-17: qty 1000

## 2018-05-17 MED ORDER — FENTANYL CITRATE (PF) 100 MCG/2ML IJ SOLN
INTRAMUSCULAR | Status: DC | PRN
  Administered 2018-05-17: 100 ug via INTRAVENOUS
  Administered 2018-05-17 (×2): 50 ug via INTRAVENOUS

## 2018-05-17 MED ORDER — 0.9 % SODIUM CHLORIDE (POUR BTL) OPTIME
TOPICAL | Status: DC | PRN
  Administered 2018-05-17: 1000 mL

## 2018-05-17 MED ORDER — BUPIVACAINE-EPINEPHRINE (PF) 0.25% -1:200000 IJ SOLN
INTRAMUSCULAR | Status: DC | PRN
  Administered 2018-05-17: 10 mL

## 2018-05-17 MED ORDER — CHLORHEXIDINE GLUCONATE CLOTH 2 % EX PADS: 6.0000 | MEDICATED_PAD | Freq: Once | CUTANEOUS | Status: DC

## 2018-05-17 MED ORDER — LACTATED RINGERS IV SOLN: INTRAVENOUS | Status: DC

## 2018-05-17 MED ORDER — HYDROMORPHONE HCL 1 MG/ML IJ SOLN
1.0000 mg | INTRAMUSCULAR | Status: DC | PRN
  Administered 2018-05-17 – 2018-05-18 (×3): 1 mg via INTRAVENOUS
  Filled 2018-05-17 (×3): qty 1

## 2018-05-17 MED ORDER — HYDROXYCHLOROQUINE SULFATE 200 MG PO TABS
200.0000 mg | ORAL_TABLET | Freq: Every day | ORAL | Status: DC
  Administered 2018-05-17 – 2018-05-18 (×2): 200 mg via ORAL
  Filled 2018-05-17 (×3): qty 1

## 2018-05-17 MED ORDER — LABETALOL HCL 5 MG/ML IV SOLN
5.0000 mg | INTRAVENOUS | Status: DC | PRN
  Administered 2018-05-17: 5 mg via INTRAVENOUS
  Filled 2018-05-17 (×2): qty 4

## 2018-05-17 MED ORDER — PROPOFOL 10 MG/ML IV BOLUS
INTRAVENOUS | Status: DC | PRN
  Administered 2018-05-17: 200 mg via INTRAVENOUS

## 2018-05-17 MED ORDER — LIDOCAINE 2% (20 MG/ML) 5 ML SYRINGE
INTRAMUSCULAR | Status: AC
  Filled 2018-05-17: qty 5

## 2018-05-17 MED ORDER — FENTANYL CITRATE (PF) 100 MCG/2ML IJ SOLN
INTRAMUSCULAR | Status: AC
  Filled 2018-05-17: qty 4

## 2018-05-17 MED ORDER — MIDAZOLAM HCL 5 MG/5ML IJ SOLN
INTRAMUSCULAR | Status: DC | PRN
  Administered 2018-05-17: 2 mg via INTRAVENOUS

## 2018-05-17 MED ORDER — ONDANSETRON HCL 4 MG/2ML IJ SOLN: 4.0000 mg | Freq: Four times a day (QID) | INTRAMUSCULAR | Status: DC | PRN

## 2018-05-17 MED ORDER — FENTANYL CITRATE (PF) 100 MCG/2ML IJ SOLN
25.0000 ug | INTRAMUSCULAR | Status: DC | PRN
  Administered 2018-05-17 (×3): 50 ug via INTRAVENOUS

## 2018-05-17 MED ORDER — LIDOCAINE 2% (20 MG/ML) 5 ML SYRINGE
INTRAMUSCULAR | Status: DC | PRN
  Administered 2018-05-17: 80 mg via INTRAVENOUS

## 2018-05-17 MED ORDER — LACTATED RINGERS IV SOLN
INTRAVENOUS | Status: DC
  Administered 2018-05-17: 07:00:00 via INTRAVENOUS

## 2018-05-17 MED ORDER — LOSARTAN POTASSIUM 50 MG PO TABS
50.0000 mg | ORAL_TABLET | Freq: Every day | ORAL | Status: DC
  Administered 2018-05-17 – 2018-05-18 (×2): 50 mg via ORAL
  Filled 2018-05-17 (×2): qty 1

## 2018-05-17 MED ORDER — HYDRALAZINE HCL 20 MG/ML IJ SOLN
INTRAMUSCULAR | Status: AC
  Administered 2018-05-17: 5 mg
  Filled 2018-05-17: qty 1

## 2018-05-17 MED ORDER — ACETAMINOPHEN 650 MG RE SUPP: 650.0000 mg | Freq: Four times a day (QID) | RECTAL | Status: DC | PRN

## 2018-05-17 MED ORDER — LIP MEDEX EX OINT
TOPICAL_OINTMENT | CUTANEOUS | Status: AC
  Administered 2018-05-17: 17:00:00
  Filled 2018-05-17: qty 7

## 2018-05-17 MED ORDER — ROCURONIUM BROMIDE 10 MG/ML (PF) SYRINGE
PREFILLED_SYRINGE | INTRAVENOUS | Status: DC | PRN
  Administered 2018-05-17: 40 mg via INTRAVENOUS
  Administered 2018-05-17: 10 mg via INTRAVENOUS

## 2018-05-17 MED ORDER — BUPIVACAINE HCL 0.25 % IJ SOLN: INTRAMUSCULAR | Status: DC | PRN

## 2018-05-17 MED ORDER — ONDANSETRON HCL 4 MG/2ML IJ SOLN
INTRAMUSCULAR | Status: AC
  Filled 2018-05-17: qty 2

## 2018-05-17 MED ORDER — DEXAMETHASONE SODIUM PHOSPHATE 10 MG/ML IJ SOLN
INTRAMUSCULAR | Status: AC
  Filled 2018-05-17: qty 1

## 2018-05-17 MED ORDER — BUPIVACAINE-EPINEPHRINE (PF) 0.25% -1:200000 IJ SOLN
INTRAMUSCULAR | Status: AC
  Filled 2018-05-17: qty 30

## 2018-05-17 MED ORDER — MIDAZOLAM HCL 2 MG/2ML IJ SOLN
INTRAMUSCULAR | Status: AC
  Filled 2018-05-17: qty 2

## 2018-05-17 MED ORDER — TRAMADOL HCL 50 MG PO TABS
50.0000 mg | ORAL_TABLET | Freq: Four times a day (QID) | ORAL | Status: DC | PRN
  Administered 2018-05-18: 50 mg via ORAL
  Filled 2018-05-17: qty 1

## 2018-05-17 MED ORDER — PROMETHAZINE HCL 25 MG/ML IJ SOLN: 6.2500 mg | INTRAMUSCULAR | Status: DC | PRN

## 2018-05-17 MED ORDER — LABETALOL HCL 5 MG/ML IV SOLN
INTRAVENOUS | Status: DC | PRN
  Administered 2018-05-17: 5 mg via INTRAVENOUS

## 2018-05-17 MED ORDER — METFORMIN HCL ER 500 MG PO TB24
500.0000 mg | ORAL_TABLET | Freq: Every day | ORAL | Status: DC
  Administered 2018-05-17: 500 mg via ORAL
  Filled 2018-05-17: qty 1

## 2018-05-17 MED ORDER — FENTANYL CITRATE (PF) 250 MCG/5ML IJ SOLN
INTRAMUSCULAR | Status: AC
  Filled 2018-05-17: qty 5

## 2018-05-17 MED ORDER — ROCURONIUM BROMIDE 100 MG/10ML IV SOLN
INTRAVENOUS | Status: AC
  Filled 2018-05-17: qty 1

## 2018-05-17 MED ORDER — ACETAMINOPHEN 325 MG PO TABS
650.0000 mg | ORAL_TABLET | Freq: Four times a day (QID) | ORAL | Status: DC | PRN
  Administered 2018-05-17 – 2018-05-18 (×2): 650 mg via ORAL
  Filled 2018-05-17 (×2): qty 2

## 2018-05-17 MED ORDER — HYDROCHLOROTHIAZIDE 25 MG PO TABS
25.0000 mg | ORAL_TABLET | Freq: Every day | ORAL | Status: DC
  Administered 2018-05-17 – 2018-05-18 (×2): 25 mg via ORAL
  Filled 2018-05-17 (×2): qty 1

## 2018-05-17 MED ORDER — SUGAMMADEX SODIUM 500 MG/5ML IV SOLN
INTRAVENOUS | Status: AC
  Filled 2018-05-17: qty 5

## 2018-05-17 MED ORDER — HYDRALAZINE HCL 20 MG/ML IJ SOLN
10.0000 mg | Freq: Once | INTRAMUSCULAR | Status: AC
  Administered 2018-05-17: 10 mg via INTRAVENOUS

## 2018-05-17 MED ORDER — ONDANSETRON 4 MG PO TBDP: 4.0000 mg | ORAL_TABLET | Freq: Four times a day (QID) | ORAL | Status: DC | PRN

## 2018-05-17 MED ORDER — HYDRALAZINE HCL 20 MG/ML IJ SOLN
INTRAMUSCULAR | Status: AC
  Filled 2018-05-17: qty 1

## 2018-05-17 MED ORDER — MEPERIDINE HCL 50 MG/ML IJ SOLN: 6.2500 mg | INTRAMUSCULAR | Status: DC | PRN

## 2018-05-17 MED ORDER — DEXAMETHASONE SODIUM PHOSPHATE 10 MG/ML IJ SOLN
INTRAMUSCULAR | Status: DC | PRN
  Administered 2018-05-17: 8 mg via INTRAVENOUS

## 2018-05-17 MED ORDER — SUGAMMADEX SODIUM 500 MG/5ML IV SOLN
INTRAVENOUS | Status: DC | PRN
  Administered 2018-05-17: 300 mg via INTRAVENOUS

## 2018-05-17 MED ORDER — PROPOFOL 10 MG/ML IV BOLUS
INTRAVENOUS | Status: AC
  Filled 2018-05-17: qty 40

## 2018-05-17 SURGICAL SUPPLY — 30 items
ATTRACTOMAT 16X20 MAGNETIC DRP (DRAPES) ×3 IMPLANT
BLADE SURG 15 STRL LF DISP TIS (BLADE) ×1 IMPLANT
BLADE SURG 15 STRL SS (BLADE) ×3
CHLORAPREP W/TINT 26ML (MISCELLANEOUS) ×3 IMPLANT
CLIP VESOCCLUDE MED 6/CT (CLIP) ×6 IMPLANT
CLIP VESOCCLUDE SM WIDE 6/CT (CLIP) ×6 IMPLANT
CLOSURE WOUND 1/2 X4 (GAUZE/BANDAGES/DRESSINGS) ×1
COVER SURGICAL LIGHT HANDLE (MISCELLANEOUS) ×3 IMPLANT
DRAPE LAPAROTOMY T 98X78 PEDS (DRAPES) ×3 IMPLANT
ELECT PENCIL ROCKER SW 15FT (MISCELLANEOUS) ×3 IMPLANT
ELECT REM PT RETURN 15FT ADLT (MISCELLANEOUS) ×3 IMPLANT
GAUZE 4X4 16PLY RFD (DISPOSABLE) ×3 IMPLANT
GAUZE SPONGE 4X4 12PLY STRL (GAUZE/BANDAGES/DRESSINGS) ×2 IMPLANT
GLOVE SURG ORTHO 8.0 STRL STRW (GLOVE) ×3 IMPLANT
GOWN STRL REUS W/TWL XL LVL3 (GOWN DISPOSABLE) ×9 IMPLANT
HEMOSTAT SURGICEL 2X4 FIBR (HEMOSTASIS) ×2 IMPLANT
ILLUMINATOR WAVEGUIDE N/F (MISCELLANEOUS) ×2 IMPLANT
KIT BASIN OR (CUSTOM PROCEDURE TRAY) ×3 IMPLANT
NDL HYPO 25X1 1.5 SAFETY (NEEDLE) ×1 IMPLANT
NEEDLE HYPO 25X1 1.5 SAFETY (NEEDLE) ×3 IMPLANT
PACK BASIC VI WITH GOWN DISP (CUSTOM PROCEDURE TRAY) ×3 IMPLANT
POWDER SURGICEL 3.0 GRAM (HEMOSTASIS) IMPLANT
STRIP CLOSURE SKIN 1/2X4 (GAUZE/BANDAGES/DRESSINGS) ×1 IMPLANT
SUT MNCRL AB 4-0 PS2 18 (SUTURE) ×3 IMPLANT
SUT VIC AB 3-0 SH 18 (SUTURE) ×5 IMPLANT
SYR BULB IRRIGATION 50ML (SYRINGE) ×3 IMPLANT
SYR CONTROL 10ML LL (SYRINGE) ×3 IMPLANT
TOWEL OR 17X26 10 PK STRL BLUE (TOWEL DISPOSABLE) ×3 IMPLANT
TOWEL OR NON WOVEN STRL DISP B (DISPOSABLE) ×3 IMPLANT
YANKAUER SUCT BULB TIP 10FT TU (MISCELLANEOUS) ×3 IMPLANT

## 2018-05-17 NOTE — Op Note (Signed)
Procedure Note  Pre-operative Diagnosis:  Primary hyperparathyroidism  Post-operative Diagnosis: Same  Surgeon:  Armandina Gemma, MD  Assistant: None   Procedure: Neck exploration with left parathyroidectomy  Anesthesia: General  Estimated Blood Loss: Minimal  Drains: None         Specimen: Left thyro-thymic tract, left parathyroid adenoma both to pathology  Indications:  Patient is referred by Dr. Alphonsa Overall for evaluation and management of suspected primary hyperparathyroidism. Patient's primary care physician is Dr. Cari Caraway. Patient had been noted on routine laboratory studies to have an elevated calcium level. Most recent laboratory studies provided include a calcium level of 10.6 and an intact PTH level of 92. 24-hour urine collection was markedly elevated at 594. Patient was seen and evaluated by my partner, Dr. Alphonsa Overall. She underwent nuclear medicine parathyroid scan which was suggestive of a left inferior parathyroid adenoma but not felt to be definitive. Subsequent ultrasound of the neck was obtained which showed no thyroid nodules and no evidence of parathyroid adenoma.   Procedure Details:  The patient was seen in the pre-op holding area. The risks, benefits, complications, treatment options, and expected outcomes were previously discussed with the patient. The patient agreed with the proposed plan and has signed the informed consent form.  The patient was brought to the Operating Room, identified as OPRAH CAMARENA and the procedure verified. A "time out" was completed and the above information confirmed.  After induction of general endotracheal anesthesia, the patient was positioned and then prepped and draped in usual aseptic fashion.  After ascertaining that an adequate level of anesthesia had been achieved, a Kocher incision is made with a #15 blade.  Dissection was carried through the subcutaneous tissues and platysma.  Hemostasis is achieved with the  electrocautery.  Skin flaps were elevated cephalad and caudad from the thyroid notch to the sternal notch.  A Mahorner self-retaining retractor was placed for exposure.  Dissection is begun on the left side.  Strap muscles are reflected laterally.  Left thyroid lobe was mobilized.  Venous tributaries were divided between small ligaclips.  The left thyro-thymic tract appears quite full.  It is easily dissected out.  Dissection is carried down beneath the clavicle and the thyroid thymic tract is divided between medium ligaclips and excised.  There is a nodular area which is marked with a suture and submitted to pathology for frozen section.  Dr. Vicente Males states this appears to be lymphoid tissue or possibly thymus but does not represent parathyroid tissue.  Further mobilization of the left thyroid lobe allows for complete exploration.  Posterior to the midportion of the left lobe is a nodular mass measuring approximately 1.5 cm in size.  This has the appearance of a parathyroid adenoma.  It is mobilized in its vascular pedicle divided between medium ligaclips and it is excised.  It is submitted for frozen section biopsy which confirms hypercellular parathyroid tissue consistent with parathyroid adenoma.  Further dissection on the left reveals no other enlarged parathyroid tissue.  A dry pack is placed in the left neck.  Next we turned our attention to the right thyroid lobe which is mobilized from the strap muscles.  Strap muscles are reflected laterally.  Right lobe was elevated.  Thyroid thymic tract appeared normal.  Further mobilization of the right lobe allowed for identification of what appears to be a normal right inferior parathyroid gland located posterior to the inferior pole of the right lobe of the thyroid.  Superior pole of the thyroid  is mobilized but no evidence of enlarged parathyroid tissue is identified.  Fibrillar is placed throughout the operative field.  Good hemostasis is noted.   Strap muscles were reapproximated in the midline of interrupted 3-0 Vicryl sutures.  Platysma was closed with interrupted 3-0 Vicryl sutures.  Skin is anesthetized with local anesthetic.  Skin edges are reapproximated with a running 4-0 Monocryl subcuticular suture.  Wound is washed and dried and Steri-Strips are applied.  Sterile dressings are applied.  Patient is awakened from anesthesia and brought to the recovery room.  The patient tolerated the procedure well.   Armandina Gemma, MD [facility] Office: [phone]

## 2018-05-17 NOTE — Interval H&P Note (Signed)
History and Physical Interval Note:  05/17/2018 7:08 AM  [name]  has presented today for surgery, with the diagnosis of Primary hyperparathyroidism.  The various methods of treatment have been discussed with the patient and family. After consideration of risks, benefits and other options for treatment, the patient has consented to    Procedure(s): NECK EXPLORATION WITH PARATHYROIDECTOMY (N/A) as a surgical intervention .    The patient's history has been reviewed, patient examined, no change in status, stable for surgery.  I have reviewed the patient's chart and labs.  Questions were answered to the patient's satisfaction.    Armandina Gemma, [place] Office: [place]

## 2018-05-17 NOTE — Anesthesia Procedure Notes (Signed)
Procedure Name: Intubation Date/Time: 05/17/2018 7:43 AM Performed by: Victoriano Lain, CRNA Pre-anesthesia Checklist: Patient identified, Emergency Drugs available, Suction available, Patient being monitored and Timeout performed Patient Re-evaluated:Patient Re-evaluated prior to induction Oxygen Delivery Method: Circle system utilized Preoxygenation: Pre-oxygenation with 100% oxygen Induction Type: IV induction Ventilation: Mask ventilation without difficulty and Oral airway inserted - appropriate to patient size Laryngoscope Size: Mac and 4 Grade View: Grade I Tube type: Oral Tube size: 7.5 mm Number of attempts: 1 Airway Equipment and Method: Stylet Placement Confirmation: ETT inserted through vocal cords under direct vision,  positive ETCO2 and breath sounds checked- equal and bilateral Secured at: 21 cm Tube secured with: Tape Dental Injury: Teeth and Oropharynx as per pre-operative assessment

## 2018-05-17 NOTE — Progress Notes (Signed)
Hydralazine 10 mg given.

## 2018-05-17 NOTE — Anesthesia Postprocedure Evaluation (Signed)
Anesthesia Post Note  Patient: [name]  Procedure(s) Performed: NECK EXPLORATION WITH PARATHYROIDECTOMY (N/A )     Patient location during evaluation: PACU Anesthesia Type: General Level of consciousness: awake and alert Pain management: pain level controlled Vital Signs Assessment: post-procedure vital signs reviewed and stable Respiratory status: spontaneous breathing, nonlabored ventilation, respiratory function stable and patient connected to nasal cannula oxygen Cardiovascular status: blood pressure returned to baseline and stable Postop Assessment: no apparent nausea or vomiting Anesthetic complications: no    Last Vitals:  Vitals:   05/17/18 1100 05/17/18 1112  BP:  (!) 149/71  Pulse: 85 63  Resp:  14  Temp:  37.2 C  SpO2: 98% 95%    Last Pain:  Vitals:   05/17/18 1112  TempSrc: Oral  PainSc:                  [name]

## 2018-05-17 NOTE — Progress Notes (Signed)
Dr Smith Robert states pt may tx to room at this time.

## 2018-05-17 NOTE — Anesthesia Preprocedure Evaluation (Addendum)
Anesthesia Evaluation  Patient identified by MRN, date of birth, ID band Patient awake    Reviewed: Allergy & Precautions, NPO status , Patient's Chart, lab work & pertinent test results  Airway Mallampati: II  TM Distance: >3 FB Neck ROM: Full    Dental  (+) Missing,    Pulmonary sleep apnea ,    breath sounds clear to auscultation       Cardiovascular hypertension, Pt. on medications  Rhythm:Regular Rate:Normal     Neuro/Psych negative neurological ROS     GI/Hepatic Neg liver ROS, GERD  Medicated,  Endo/Other  diabetes, Type 2, Oral Hypoglycemic Agents  Renal/GU negative Renal ROS     Musculoskeletal  (+) Arthritis , Rheumatoid disorders,    Abdominal (+) + obese,   Peds  Hematology negative hematology ROS (+)   Anesthesia Other Findings   Reproductive/Obstetrics                            Anesthesia Physical Anesthesia Plan  ASA: III  Anesthesia Plan: General   Post-op Pain Management:    Induction: Intravenous  PONV Risk Score and Plan: 4 or greater and Ondansetron, Dexamethasone, Midazolam and Scopolamine patch - Pre-op  Airway Management Planned: Oral ETT  Additional Equipment: None  Intra-op Plan:   Post-operative Plan: Extubation in OR  Informed Consent: I have reviewed the patients History and Physical, chart, labs and discussed the procedure including the risks, benefits and alternatives for the proposed anesthesia with the patient or authorized representative who has indicated his/her understanding and acceptance.   Dental advisory given  Plan Discussed with: CRNA  Anesthesia Plan Comments:        Anesthesia Quick Evaluation

## 2018-05-17 NOTE — Progress Notes (Addendum)
BP 209/106. Order received to give Hydralazine 10mg  may repeat x 3 in pacu - per Dr Smith Robert

## 2018-05-17 NOTE — Transfer of Care (Signed)
Immediate Anesthesia Transfer of Care Note  Patient: [name]  Procedure(s) Performed: NECK EXPLORATION WITH PARATHYROIDECTOMY (N/A )  Patient Location: PACU  Anesthesia Type:General  Level of Consciousness: awake, alert , oriented and patient cooperative  Airway & Oxygen Therapy: Patient Spontanous Breathing and Patient connected to face mask oxygen  Post-op Assessment: Report given to RN, Post -op Vital signs reviewed and stable and Patient moving all extremities  Post vital signs: Reviewed and stable  Last Vitals:  Vitals Value Taken Time  BP 220/118 05/17/2018  9:11 AM  Temp    Pulse 62 05/17/2018  9:12 AM  Resp 12 05/17/2018  9:12 AM  SpO2 98 % 05/17/2018  9:12 AM  Vitals shown include unvalidated device data.  Last Pain:  Vitals:   05/17/18 0619  TempSrc:   PainSc: 4       Patients Stated Pain Goal: 4 (91/50/41 3643)  Complications: Pt with no apparent anesthesia complications

## 2018-05-18 ENCOUNTER — Encounter (HOSPITAL_COMMUNITY): Payer: Self-pay | Admitting: Surgery

## 2018-05-18 DIAGNOSIS — E210 Primary hyperparathyroidism: Secondary | ICD-10-CM | POA: Diagnosis not present

## 2018-05-18 LAB — BASIC METABOLIC PANEL
ANION GAP: 9 (ref 5–15)
BUN: 14 mg/dL (ref 6–20)
CHLORIDE: 104 mmol/L (ref 98–111)
CO2: 29 mmol/L (ref 22–32)
Calcium: 9.1 mg/dL (ref 8.9–10.3)
Creatinine, Ser: 0.69 mg/dL (ref 0.44–1.00)
GFR calc non Af Amer: >60 mL/min (ref >60)
Glucose, Bld: 118 mg/dL — ABNORMAL HIGH (ref 70–99)
POTASSIUM: 3.5 mmol/L (ref 3.5–5.1)
Sodium: 142 mmol/L (ref 135–145)

## 2018-05-18 MED ORDER — TRAMADOL HCL 50 MG PO TABS: 50.0000 mg | ORAL_TABLET | Freq: Four times a day (QID) | ORAL | 0 refills | Status: DC | PRN

## 2018-05-18 NOTE — Progress Notes (Signed)
Assessment unchanged. Pt verbalized understanding of dc instructions through teach back. Script x 1 given at dc. Discharged via wc to front entrance. Accompanied by NT and family member.

## 2018-05-18 NOTE — Discharge Summary (Signed)
Physician Discharge Summary [facility]  Patient ID: [name] MRN: [id number] DOB/AGE: Mar 18, 1961 57 y.o.  Admit date: 05/17/2018 Discharge date: 05/18/2018  Admission Diagnoses:  Primary hyperparathyroidism  Discharge Diagnoses:  Principal Problem:   Hyperparathyroidism, primary ([place]) Active Problems:   Primary hyperparathyroidism [facility])   Discharged Condition: good  Hospital Course: Patient was admitted for observation following parathyroid surgery.  Post op course was uncomplicated.  Pain was well controlled.  Tolerated diet.  Post op calcium level on morning following surgery was 9.1 mg/dl.  Patient was prepared for discharge home on POD#1.  Consults: None  Treatments: surgery: neck exploration with parathyroidectomy  Discharge Exam: Blood pressure (!) 164/76, pulse 73, temperature 98.6 F (37 C), temperature source Oral, resp. rate 16, height 5\' 2"  (1.575 m), weight 127 kg, SpO2 99 %. HEENT - clear Neck - wound dry and intact; mild STS; voice normal Chest - clear bilaterally Cor - RRR  Disposition: Home  Discharge Instructions    Diet - low sodium heart healthy   Complete by:  As directed    Discharge instructions   Complete by:  As directed    [facility]  THYROID & PARATHYROID SURGERY:  POST-OP INSTRUCTIONS  Always review your discharge instruction sheet from the facility where your surgery was performed.  A prescription for pain medication may be given to you upon discharge.  Take your pain medication as prescribed.  If narcotic pain medicine is not needed, then you may take acetaminophen (Tylenol) or ibuprofen (Advil) as needed.  Take your usually prescribed medications unless otherwise directed.  If you need a refill on your pain medication, please contact our office during regular business hours.  Prescriptions cannot be processed by our office after 5 pm or on weekends.  Start with a light diet upon arrival  home, such as soup and crackers or toast.  Be sure to drink plenty of fluids daily.  Resume your normal diet the day after surgery.  Most patients will experience some swelling and bruising on the chest and neck area.  Ice packs will help.  Swelling and bruising can take several days to resolve.   It is common to experience some constipation after surgery.  Increasing fluid intake and taking a stool softener (Colace) will usually help or prevent this problem.  A mild laxative (Milk of Magnesia or Miralax) should be taken according to package directions if there has been no bowel movement after 48 hours.  You have steri-strips and a gauze dressing over your incision.  You may remove the gauze bandage on the second day after surgery, and you may shower at that time.  Leave your steri-strips (small skin tapes) in place directly over the incision.  These strips should remain on the skin for 5-7 days and then be removed.  You may get them wet in the shower and pat them dry.  You may resume regular (light) daily activities beginning the next day (such as daily self-care, walking, climbing stairs) gradually increasing activities as tolerated.  You may have sexual intercourse when it is comfortable.  Refrain from any heavy lifting or straining until approved by your doctor.  You may drive when you no longer are taking prescription pain medication, you can comfortably wear a seatbelt, and you can safely maneuver your car and apply brakes.  You should see your doctor in the office for a follow-up appointment approximately three weeks after your surgery.  Make sure that you  call for this appointment within a day or two after you arrive home to insure a convenient appointment time.  WHEN TO CALL YOUR DOCTOR: -- Fever greater than 101.5 -- Inability to urinate -- Nausea and/or vomiting - persistent -- Extreme swelling or bruising -- Continued bleeding from incision -- Increased pain, redness, or drainage from  the incision -- Difficulty swallowing or breathing -- Muscle cramping or spasms -- Numbness or tingling in hands or around lips  The clinic staff is available to answer your questions during regular business hours.  Please don't hesitate to call and ask to speak to one of the nurses if you have concerns.  Armandina Gemma, MD [facility] Office: [phone]   Increase activity slowly   Complete by:  As directed    Remove dressing in 24 hours   Complete by:  As directed      Allergies as of 05/18/2018      Reactions   Lisinopril Cough      Medication List    STOP taking these medications   HYDROcodone-acetaminophen 5-325 MG tablet Commonly known as:  NORCO/VICODIN     TAKE these medications   acetaminophen 500 MG tablet Commonly known as:  TYLENOL Take 2,000 mg by mouth every 6 (six) hours as needed for moderate pain or headache.   CALCIUM 600 + D PO Take 1 tablet by mouth daily.   cetirizine 10 MG tablet Commonly known as:  ZYRTEC Take 10 mg by mouth daily.   folic acid 1 MG tablet Commonly known as:  FOLVITE Take 1 mg by mouth daily.   hydrochlorothiazide 25 MG tablet Commonly known as:  HYDRODIURIL Take 25 mg by mouth daily.   hydroxychloroquine 200 MG tablet Commonly known as:  PLAQUENIL Take 200 mg by mouth daily.   losartan 50 MG tablet Commonly known as:  COZAAR Take 50 mg by mouth daily.   metFORMIN 500 MG 24 hr tablet Commonly known as:  GLUCOPHAGE-XR Take 500 mg by mouth every evening.   methotrexate 2.5 MG tablet Commonly known as:  RHEUMATREX Take 25 mg by mouth every Sunday.   omeprazole 20 MG capsule Commonly known as:  PRILOSEC Take 20 mg by mouth daily as needed (for acid reflux).   SYSTANE OP Place 1 drop into both eyes 2 (two) times daily as needed (for dry eyes).   traMADol 50 MG tablet Commonly known as:  ULTRAM Take 1-2 tablets (50-100 mg total) by mouth every 6 (six) hours as needed.      Follow-up  Information    Armandina Gemma, MD. Schedule an appointment as soon as possible for a visit in 3 week(s).   Specialty:  General Surgery Contact information: [address] [place] [address] [phone]           Earnstine Regal, MD, [facility] Office: [phone]   Signed: Earnstine Regal 05/18/2018, 9:22 AM

## 2018-05-20 ENCOUNTER — Other Ambulatory Visit: Payer: Self-pay

## 2018-05-20 ENCOUNTER — Emergency Department (HOSPITAL_BASED_OUTPATIENT_CLINIC_OR_DEPARTMENT_OTHER): Payer: Medicare Other

## 2018-05-20 ENCOUNTER — Emergency Department (HOSPITAL_COMMUNITY)
Admission: EM | Admit: 2018-05-20 | Discharge: 2018-05-20 | Disposition: A | Discharge status: Home / Self Care | Payer: Medicare Other | Attending: Emergency Medicine | Admitting: Emergency Medicine

## 2018-05-20 DIAGNOSIS — M7989 Other specified soft tissue disorders: Secondary | ICD-10-CM

## 2018-05-20 DIAGNOSIS — Z9889 Other specified postprocedural states: Secondary | ICD-10-CM | POA: Diagnosis not present

## 2018-05-20 DIAGNOSIS — Z853 Personal history of malignant neoplasm of breast: Secondary | ICD-10-CM | POA: Insufficient documentation

## 2018-05-20 DIAGNOSIS — R202 Paresthesia of skin: Secondary | ICD-10-CM | POA: Insufficient documentation

## 2018-05-20 DIAGNOSIS — M79609 Pain in unspecified limb: Secondary | ICD-10-CM

## 2018-05-20 DIAGNOSIS — R600 Localized edema: Secondary | ICD-10-CM | POA: Insufficient documentation

## 2018-05-20 DIAGNOSIS — R2242 Localized swelling, mass and lump, left lower limb: Secondary | ICD-10-CM | POA: Diagnosis present

## 2018-05-20 DIAGNOSIS — Z7984 Long term (current) use of oral hypoglycemic drugs: Secondary | ICD-10-CM | POA: Insufficient documentation

## 2018-05-20 DIAGNOSIS — Z79899 Other long term (current) drug therapy: Secondary | ICD-10-CM | POA: Diagnosis not present

## 2018-05-20 DIAGNOSIS — R609 Edema, unspecified: Secondary | ICD-10-CM

## 2018-05-20 LAB — COMPREHENSIVE METABOLIC PANEL
ALBUMIN: 3.8 g/dL (ref 3.5–5.0)
ALT: 20 U/L (ref 0–44)
AST: 16 U/L (ref 15–41)
Alkaline Phosphatase: 93 U/L (ref 38–126)
Anion gap: 9 (ref 5–15)
BUN: 23 mg/dL — ABNORMAL HIGH (ref 6–20)
CALCIUM: 9.3 mg/dL (ref 8.9–10.3)
CO2: 30 mmol/L (ref 22–32)
CREATININE: 0.62 mg/dL (ref 0.44–1.00)
Chloride: 105 mmol/L (ref 98–111)
GFR calc Af Amer: >60 mL/min (ref >60)
GFR calc non Af Amer: >60 mL/min (ref >60)
GLUCOSE: 106 mg/dL — AB (ref 70–99)
Potassium: 3.7 mmol/L (ref 3.5–5.1)
SODIUM: 144 mmol/L (ref 135–145)
Total Bilirubin: 0.3 mg/dL (ref 0.3–1.2)
Total Protein: 7.2 g/dL (ref 6.5–8.1)

## 2018-05-20 LAB — CBC WITH DIFFERENTIAL/PLATELET
BASOS ABS: 0 10*3/uL (ref 0.0–0.1)
BASOS PCT: 0 %
EOS ABS: 0.1 10*3/uL (ref 0.0–0.7)
Eosinophils Relative: 2 %
HCT: 34.6 % — ABNORMAL LOW (ref 36.0–46.0)
Hemoglobin: 11.1 g/dL — ABNORMAL LOW (ref 12.0–15.0)
Lymphocytes Relative: 28 %
Lymphs Abs: 2.3 10*3/uL (ref 0.7–4.0)
MCH: 28.7 pg (ref 26.0–34.0)
MCHC: 32.1 g/dL (ref 30.0–36.0)
MCV: 89.4 fL (ref 78.0–100.0)
Monocytes Absolute: 0.6 10*3/uL (ref 0.1–1.0)
Monocytes Relative: 7 %
NEUTROS PCT: 63 %
Neutro Abs: 5.1 10*3/uL (ref 1.7–7.7)
Platelets: 235 10*3/uL (ref 150–400)
RBC: 3.87 MIL/uL (ref 3.87–5.11)
RDW: 16.4 % — ABNORMAL HIGH (ref 11.5–15.5)
WBC: 8.1 10*3/uL (ref 4.0–10.5)

## 2018-05-20 LAB — MAGNESIUM: Magnesium: 1.9 mg/dL (ref 1.7–2.4)

## 2018-05-20 LAB — TSH: TSH: 1.373 u[IU]/mL (ref 0.350–4.500)

## 2018-05-20 LAB — I-STAT TROPONIN, ED: Troponin i, poc: 0.03 ng/mL (ref 0.00–0.08)

## 2018-05-20 LAB — BRAIN NATRIURETIC PEPTIDE: B Natriuretic Peptide: 16.6 pg/mL (ref 0.0–100.0)

## 2018-05-20 MED ORDER — MORPHINE SULFATE (PF) 4 MG/ML IV SOLN
4.0000 mg | Freq: Once | INTRAVENOUS | Status: AC
  Administered 2018-05-20: 4 mg via INTRAVENOUS
  Filled 2018-05-20: qty 1

## 2018-05-20 MED ORDER — AMOXICILLIN-POT CLAVULANATE 875-125 MG PO TABS: 1.0000 | ORAL_TABLET | Freq: Two times a day (BID) | ORAL | 0 refills | Status: DC

## 2018-05-20 NOTE — ED Provider Notes (Signed)
MSE was initiated and I personally evaluated the patient and placed orders (if any) at  5:00 PM on May 20, 2018.  [name] is a 57 y.o. female with a PMH of lap band surgery, HTN, breast cancer, GERD and morbid obesity who presents to the ED with left leg pain, swelling and redness that started 2 days ago and has gotten progressively worse. Patient reports having surgery on her neck 3 days ago for neck exploration and parathyroidectomy. She c/o left arm numbness and tingling. She has bilateral lower extremity edema.  BP (!) 160/97   Pulse 75   Temp 99.5 F (37.5 C) (Oral)   Resp 14   Ht 5\' 2"  (1.575 m)   Wt 127 kg   SpO2 98%   BMI 51.21 kg/m   The patient appears stable so that the remainder of the MSE may be completed by another provider.   Debroah Baller [place], [place] 05/20/18 1715    Drenda Freeze, MD 05/20/18 2236

## 2018-05-20 NOTE — Discharge Instructions (Signed)
Continue taking tramadol as needed for pain.   Your wound appears well currently and there is no obvious blood clot in your leg.   You can take augmentin twice daily for a week to prevent infection   See your surgeon as scheduled for postop visit   Return to ER if you have worse arm numbness, leg pain, chest pain, trouble breathing.

## 2018-05-20 NOTE — Progress Notes (Signed)
Preliminary notes--- Left lower extremity venous duplex exam completed. Negative for DVT where exam can visualize veins. Limited study cannot completely exclude possible calf veins thrombosis existence.  Hongying Musa Rewerts (RDMS RVT) 05/20/18 7:10 PM

## 2018-05-20 NOTE — ED Triage Notes (Signed)
Patient with c/o left leg pain, swelling, and redness that started Saturday and progressively getting worse. Patient with hx rheumatoid arthritis. Patient had surgery on neck Friday. Patient denies CP/SOB. Reports left arm numbness/tingling. Bilateral lower extremities with edema noted.

## 2018-05-20 NOTE — ED Provider Notes (Signed)
[name] DEPT Provider Note   CSN: [id number] Arrival date & time: 05/20/18  1559     History   Chief Complaint Chief Complaint  Patient presents with  . Post-op Problem  . Leg Swelling    HPI [name] is a 57 y.o. female hx of reflux, GERD, rheumatoid arthritis, here with left arm tingling, left calf pain.  Patient had parathyroidectomy done on 8/23 by Dr. Harlow Asa.  Patient states that the last several days, she has been noting some left leg pain and swelling.  She states that she is concerned that she may have a blood clot in her leg.  She also has some numbness and tingling down the left arm as well.  Patient denies any chest pain or shortness of breath.  Patient denies any trouble speaking as well.  Patient is concerned because she has previous bilateral knee replacement and is concerned that if she has an infection it would go into her knees.   The history is provided by the patient.    Past Medical History:  Diagnosis Date  . Allergy   . Arthritis   . Breast cancer ([place]) 2008  . GERD (gastroesophageal reflux disease)    better since lap band out  . Hypertension   . LAP-BAND surgery status   . Morbid obesity ([place])   . Pre-diabetes   . RA (rheumatoid arthritis) ([place]) 2013  . Rheumatoid arthritis(714.0) 2013   Feet, joints, hands  . Sleep apnea     mild no cpap    Patient Active Problem List   Diagnosis Date Noted  . Primary hyperparathyroidism ([place]) 05/17/2018  . Hyperparathyroidism, primary ([place]) 05/16/2018  . History of removal of laparoscopic gastric banding device 10/18/2015  . Breast cancer ([place]) 02/12/2013  . History of laparoscopic adjustable gastric banding, APS, 08/04/2008 10/10/2012    Past Surgical History:  Procedure Laterality Date  . ABDOMINAL HYSTERECTOMY    . BREAST RECONSTRUCTION Bilateral 06/2010  . BREAST RECONSTRUCTION Bilateral 01/05/2014   Procedure: BILATERAL RECONSTRUCTION REVISION AND FLUIDS TO  IMPLANTS AND REMOVAL IMPLANT PORTALS;  Surgeon: Cristine Polio, MD;  Location: [facility];  Service: Plastics;  Laterality: Bilateral;  . [place]  . LAPAROSCOPIC GASTRIC BANDING  07/2008   removed due to it broke  . MASTECTOMY Bilateral 2008  . PARATHYROID EXPLORATION N/A 05/17/2018   Procedure: NECK EXPLORATION WITH PARATHYROIDECTOMY;  Surgeon: Armandina Gemma, MD;  Location: [facility];  Service: General;  Laterality: N/A;  . TOTAL KNEE ARTHROPLASTY Bilateral 12/2009; 02/2010  . TUBAL LIGATION    . TYMPANIC MEMBRANE REPAIR Left 1983   Ear drum plate     OB History   None      Home Medications    Prior to Admission medications   Medication Sig Start Date End Date Taking? Authorizing Provider  acetaminophen (TYLENOL) 500 MG tablet Take 2,000 mg by mouth every 6 (six) hours as needed for moderate pain or headache.   Yes [provider]  cetirizine (ZYRTEC) 10 MG tablet Take 10 mg by mouth daily.    Yes [provider]  cholecalciferol (VITAMIN D) 1000 units tablet Take 1,000 Units by mouth daily.   Yes [provider]  folic acid (FOLVITE) 1 MG tablet Take 1 mg by mouth daily.  10/27/12  Yes [provider]  hydrochlorothiazide (HYDRODIURIL) 25 MG tablet Take 25 mg by mouth daily.     Yes [provider]  losartan (COZAAR)  50 MG tablet Take 50 mg by mouth daily.   Yes [provider]  metFORMIN (GLUCOPHAGE-XR) 500 MG 24 hr tablet Take 500 mg by mouth every evening.   Yes [provider]  methotrexate (RHEUMATREX) 2.5 MG tablet Take 25 mg by mouth once a week.  10/02/12  Yes [provider]  Multiple Vitamins-Minerals (MULTIVITAMIN ADULTS 50+) TABS Take 1 tablet by mouth daily.   Yes [provider]  omeprazole (PRILOSEC) 20 MG capsule Take 20 mg by mouth daily.    Yes [provider]  Polyethyl Glycol-Propyl Glycol (SYSTANE OP) Place 1 drop into both eyes 2 (two) times  daily as needed (for dry eyes).   Yes [provider]  amoxicillin-clavulanate (AUGMENTIN) 875-125 MG tablet Take 1 tablet by mouth 2 (two) times daily. One po bid x 7 days 05/20/18   Drenda Freeze, MD  traMADol (ULTRAM) 50 MG tablet Take 1-2 tablets (50-100 mg total) by mouth every 6 (six) hours as needed. Patient not taking: Reported on 05/20/2018 05/18/18   Armandina Gemma, MD    Family History Family History  Problem Relation Age of Onset  . Cancer Mother 47       Breast cancer  . Cancer Father        Colon cancer  . Cancer Sister 63       Breast cancer  . Diabetes Sister   . Cancer Maternal Aunt        Breast cancer  . Cancer Paternal Aunt        Breast cancer  . Cancer Cousin        Breast cancer    Social History Social History   Tobacco Use  . Smoking status: Never Smoker  . Smokeless tobacco: Never Used  Substance Use Topics  . Alcohol use: Not Currently    Comment: occassionally  . Drug use: No     Allergies   Lisinopril   Review of Systems Review of Systems  Musculoskeletal:       L leg pain   Neurological: Positive for numbness.  All other systems reviewed and are negative.    Physical Exam Updated Vital Signs BP (!) 164/81   Pulse 69   Temp 99.5 F (37.5 C) (Oral)   Resp 16   Ht 5\' 2"  (1.575 m)   Wt 127 kg   SpO2 100%   BMI 51.21 kg/m   Physical Exam  Constitutional: She is oriented to person, place, and time. She appears well-developed and well-nourished.  HENT:  Head: Normocephalic.  Mouth/Throat: Oropharynx is clear and moist.  Eyes: Pupils are equal, round, and reactive to light. Conjunctivae and EOM are normal.  Neck: Normal range of motion. Neck supple.  Neck scar healing well with no obvious infection. Steri strips in place   Cardiovascular: Normal rate, regular rhythm and normal heart sounds.  Pulmonary/Chest: Effort normal and breath sounds normal. No stridor. No respiratory distress. She has no wheezes.    Abdominal: Soft. Bowel sounds are normal. She exhibits no distension. There is no tenderness.  Musculoskeletal:  + L calf tenderness, nl ROM L knee. 1+ edema L leg, R leg nl neurovascular exam   Neurological: She is alert and oriented to person, place, and time. No cranial nerve deficit. Coordination normal.  CN 2-12 intact. Nl strength and sensation throughout bilateral upper and lower extremities   Skin: Skin is warm.  Psychiatric: She has a normal mood and affect.  Nursing note and vitals reviewed.  ED Treatments / Results  Labs (all labs ordered are listed, but only abnormal results are displayed) Labs Reviewed  CBC WITH DIFFERENTIAL/PLATELET - Abnormal; Notable for the following components:      Result Value   Hemoglobin 11.1 (*)    HCT 34.6 (*)    RDW 16.4 (*)    All other components within normal limits  COMPREHENSIVE METABOLIC PANEL - Abnormal; Notable for the following components:   Glucose, Bld 106 (*)    BUN 23 (*)    All other components within normal limits  TSH  BRAIN NATRIURETIC PEPTIDE  MAGNESIUM  I-STAT TROPONIN, ED    EKG EKG Interpretation  Date/Time:  Monday May 20 2018 17:55:44 EDT Ventricular Rate:  70 PR Interval:    QRS Duration: 94 QT Interval:  420 QTC Calculation: 517 R Axis:   50 Text Interpretation:  Sinus rhythm Consider left ventricular hypertrophy No significant change since last tracing Confirmed by Wandra Arthurs [phone]) on 05/20/2018 6:18:48 PM   Radiology No results found.  Procedures Procedures (including critical care time)  Medications Ordered in ED Medications  morphine 4 MG/ML injection 4 mg (4 mg Intravenous Given 05/20/18 1815)     Initial Impression / Assessment and Plan / ED Course  I have reviewed the triage vital signs and the nursing notes.  Pertinent labs & imaging results that were available during my care of the patient were reviewed by me and considered in my medical decision making (see chart for  details).    [name] is a 57 y.o. female here with L calf pain, L arm paresthesias after parathyroidectomy. Afebrile, well appearing. Wound healing well with no obvious infection. Consider DVT of the leg vs hypocalcemia after parathyroidectomy. Will get DVT study, labs.   8:22 PM Labs including BNP and calcium is normal. DVT study neg. Patient request short course of abx since is worried about her knee replacement getting infected. No signs of cellulitis at the surgical site or at the leg. Will give augmentin BID for 7 days for prophylaxis.    Final Clinical Impressions(s) / ED Diagnoses   Final diagnoses:  Peripheral edema  Leg swelling  Arm paresthesia, left    ED Discharge Orders         Ordered    amoxicillin-clavulanate (AUGMENTIN) 875-125 MG tablet  2 times daily     05/20/18 2015           Drenda Freeze, MD 05/20/18 2023

## 2018-10-03 ENCOUNTER — Ambulatory Visit: Payer: Medicare Other | Admitting: Podiatry

## 2018-10-03 ENCOUNTER — Encounter: Payer: Self-pay | Admitting: Podiatry

## 2018-10-03 VITALS — BP 154/52 | HR 65 | Resp 16

## 2018-10-03 DIAGNOSIS — L600 Ingrowing nail: Secondary | ICD-10-CM

## 2018-10-03 MED ORDER — NEOMYCIN-POLYMYXIN-HC 3.5-10000-1 OT SOLN: OTIC | 0 refills | Status: DC

## 2018-10-03 NOTE — Progress Notes (Signed)
Subjective:   Patient ID: [name], female   DOB: 58 y.o.   MRN: [id number]   HPI Patient presents with painful ingrown toenail of the big toe both feet with the right would be very thickened and dystrophic and painful with palpation.  The left is incurvated medial border and painful and she states she is tried to trim them and soak them without relief.  Patient does not smoke likes to be active   Review of Systems  All other systems reviewed and are negative.       Objective:  Physical Exam Vitals signs and nursing note reviewed.  Constitutional:      Appearance: She is well-developed.  Pulmonary:     Effort: Pulmonary effort is normal.  Musculoskeletal: Normal range of motion.  Skin:    General: Skin is warm.  Neurological:     Mental Status: She is alert.     Neurovascular status intact muscle strength is adequate range of motion within normal limits with patient noted to have thickened right hallux nail with incurvation of the medial side but the entire nail is moderately tender when palpated with the left hallux being incurvated on the medial side with pain upon palpation.  Patient is found to have good digital perfusion and is well oriented x3     Assessment:  Chronic ingrown toenail deformity hallux bilateral with damage right hallux nail with the entire nail thickened and painful     Plan:  H&P conditions reviewed condition discussed with patient.  I do think nail removal of the entire nail right is necessary due to her crepitus and pain with removal of the border left that can be occurred.  Patient wants these procedures and I allowed her to read consent form and then sign and explained procedures to get this better.  Patient wants surgery understanding risk and today I infiltrated each hallux 60 mg like Marcaine mixture removed the hallux nail right under sterile conditions with sterile instrumentation and applied phenol to the base and just the medial border of  the left hallux exposed matrix and applied 3 applications phenol followed by alcohol lavage and then applied sterile dressing bilateral.  Gave instructions on soaks and instructed on leaving bandages on 24 hours but taking them off earlier if any throbbing were to occur and wrote prescription for Corticosporin otic solution.  Encouraged to call us with any questions concerns and will be seen back to recheck

## 2018-10-03 NOTE — Patient Instructions (Signed)

## 2018-10-03 NOTE — Progress Notes (Signed)
   Subjective:    Patient ID: [name], female    DOB: 17-Sep-1961, 57 y.o.   MRN: [id number]  HPI    Review of Systems  All other systems reviewed and are negative.      Objective:   Physical Exam        Assessment & Plan:

## 2019-07-22 ENCOUNTER — Ambulatory Visit: Payer: Medicare Other | Admitting: Physical Therapy

## 2019-11-17 ENCOUNTER — Ambulatory Visit: Payer: Medicare PPO | Attending: Family

## 2019-11-17 DIAGNOSIS — Z23 Encounter for immunization: Secondary | ICD-10-CM | POA: Insufficient documentation

## 2019-11-17 NOTE — Progress Notes (Signed)
   Covid-19 Vaccination Clinic  Name:  [name]    MRN: [id number] DOB: [birth year]  11/17/2019  [name] was observed post Covid-19 immunization for 15 minutes without incidence. She was provided with Vaccine Information Sheet and instruction to access the V-Safe system.   [name] was instructed to call 911 with any severe reactions post vaccine: Marland Kitchen Difficulty breathing  . Swelling of your face and throat  . A fast heartbeat  . A bad rash all over your body  . Dizziness and weakness    Immunizations Administered    Name Date Dose VIS Date Route   Moderna COVID-19 Vaccine 11/17/2019  9:23 AM 0.5 mL 08/26/2019 Intramuscular   Manufacturer: Moderna   Lot: [id number]   [id number]

## 2019-12-16 ENCOUNTER — Ambulatory Visit: Payer: Medicare PPO | Attending: Family

## 2019-12-16 DIAGNOSIS — Z23 Encounter for immunization: Secondary | ICD-10-CM

## 2019-12-16 NOTE — Progress Notes (Signed)
   Covid-19 Vaccination Clinic  Name:  [name]    MRN: [id number] DOB: [birth year]  12/16/2019  Ms. Colligan was observed post Covid-19 immunization for 15 minutes without incident. She was provided with Vaccine Information Sheet and instruction to access the V-Safe system.   Ms. Glynn was instructed to call 911 with any severe reactions post vaccine: Marland Kitchen Difficulty breathing  . Swelling of face and throat  . A fast heartbeat  . A bad rash all over body  . Dizziness and weakness   Immunizations Administered    Name Date Dose VIS Date Route   Moderna COVID-19 Vaccine 12/16/2019  9:58 AM 0.5 mL 08/26/2019 Intramuscular   Manufacturer: Levan Hurst   Lot: [id number]   [place]BE:[id number]

## 2020-01-01 DIAGNOSIS — M0579 Rheumatoid arthritis with rheumatoid factor of multiple sites without organ or systems involvement: Secondary | ICD-10-CM | POA: Diagnosis not present

## 2020-01-01 DIAGNOSIS — Z79899 Other long term (current) drug therapy: Secondary | ICD-10-CM | POA: Diagnosis not present

## 2020-01-01 DIAGNOSIS — M2550 Pain in unspecified joint: Secondary | ICD-10-CM | POA: Diagnosis not present

## 2020-01-01 DIAGNOSIS — M25512 Pain in left shoulder: Secondary | ICD-10-CM | POA: Diagnosis not present

## 2020-01-01 DIAGNOSIS — M150 Primary generalized (osteo)arthritis: Secondary | ICD-10-CM | POA: Diagnosis not present

## 2020-01-01 DIAGNOSIS — Z6841 Body Mass Index (BMI) 40.0 and over, adult: Secondary | ICD-10-CM | POA: Diagnosis not present

## 2020-01-07 DIAGNOSIS — M25612 Stiffness of left shoulder, not elsewhere classified: Secondary | ICD-10-CM | POA: Diagnosis not present

## 2020-01-07 DIAGNOSIS — M25512 Pain in left shoulder: Secondary | ICD-10-CM | POA: Diagnosis not present

## 2020-01-15 DIAGNOSIS — M25612 Stiffness of left shoulder, not elsewhere classified: Secondary | ICD-10-CM | POA: Diagnosis not present

## 2020-01-15 DIAGNOSIS — M25512 Pain in left shoulder: Secondary | ICD-10-CM | POA: Diagnosis not present

## 2020-01-21 DIAGNOSIS — M25612 Stiffness of left shoulder, not elsewhere classified: Secondary | ICD-10-CM | POA: Diagnosis not present

## 2020-01-21 DIAGNOSIS — M25512 Pain in left shoulder: Secondary | ICD-10-CM | POA: Diagnosis not present

## 2020-01-28 DIAGNOSIS — M25512 Pain in left shoulder: Secondary | ICD-10-CM | POA: Diagnosis not present

## 2020-01-28 DIAGNOSIS — M25612 Stiffness of left shoulder, not elsewhere classified: Secondary | ICD-10-CM | POA: Diagnosis not present

## 2020-02-17 DIAGNOSIS — K219 Gastro-esophageal reflux disease without esophagitis: Secondary | ICD-10-CM | POA: Diagnosis not present

## 2020-02-17 DIAGNOSIS — I10 Essential (primary) hypertension: Secondary | ICD-10-CM | POA: Diagnosis not present

## 2020-02-17 DIAGNOSIS — R7309 Other abnormal glucose: Secondary | ICD-10-CM | POA: Diagnosis not present

## 2020-02-17 DIAGNOSIS — E559 Vitamin D deficiency, unspecified: Secondary | ICD-10-CM | POA: Diagnosis not present

## 2020-02-17 DIAGNOSIS — M0579 Rheumatoid arthritis with rheumatoid factor of multiple sites without organ or systems involvement: Secondary | ICD-10-CM | POA: Diagnosis not present

## 2020-02-17 DIAGNOSIS — D638 Anemia in other chronic diseases classified elsewhere: Secondary | ICD-10-CM | POA: Diagnosis not present

## 2020-02-17 DIAGNOSIS — J309 Allergic rhinitis, unspecified: Secondary | ICD-10-CM | POA: Diagnosis not present

## 2020-02-25 DIAGNOSIS — I10 Essential (primary) hypertension: Secondary | ICD-10-CM | POA: Diagnosis not present

## 2020-02-25 DIAGNOSIS — R7309 Other abnormal glucose: Secondary | ICD-10-CM | POA: Diagnosis not present

## 2020-02-25 DIAGNOSIS — E559 Vitamin D deficiency, unspecified: Secondary | ICD-10-CM | POA: Diagnosis not present

## 2020-02-25 DIAGNOSIS — K219 Gastro-esophageal reflux disease without esophagitis: Secondary | ICD-10-CM | POA: Diagnosis not present

## 2020-02-25 DIAGNOSIS — D638 Anemia in other chronic diseases classified elsewhere: Secondary | ICD-10-CM | POA: Diagnosis not present

## 2020-02-25 DIAGNOSIS — Z1389 Encounter for screening for other disorder: Secondary | ICD-10-CM | POA: Diagnosis not present

## 2020-02-25 DIAGNOSIS — Z0000 Encounter for general adult medical examination without abnormal findings: Secondary | ICD-10-CM | POA: Diagnosis not present

## 2020-02-25 DIAGNOSIS — G4733 Obstructive sleep apnea (adult) (pediatric): Secondary | ICD-10-CM | POA: Diagnosis not present

## 2020-03-08 ENCOUNTER — Other Ambulatory Visit: Payer: Self-pay | Admitting: Oral Surgery

## 2020-03-08 DIAGNOSIS — K1379 Other lesions of oral mucosa: Secondary | ICD-10-CM | POA: Diagnosis not present

## 2020-04-05 DIAGNOSIS — Z6841 Body Mass Index (BMI) 40.0 and over, adult: Secondary | ICD-10-CM | POA: Diagnosis not present

## 2020-04-05 DIAGNOSIS — M0579 Rheumatoid arthritis with rheumatoid factor of multiple sites without organ or systems involvement: Secondary | ICD-10-CM | POA: Diagnosis not present

## 2020-04-05 DIAGNOSIS — M150 Primary generalized (osteo)arthritis: Secondary | ICD-10-CM | POA: Diagnosis not present

## 2020-04-05 DIAGNOSIS — Z79899 Other long term (current) drug therapy: Secondary | ICD-10-CM | POA: Diagnosis not present

## 2020-04-05 DIAGNOSIS — M2550 Pain in unspecified joint: Secondary | ICD-10-CM | POA: Diagnosis not present

## 2020-06-22 DIAGNOSIS — Z23 Encounter for immunization: Secondary | ICD-10-CM | POA: Diagnosis not present

## 2020-06-22 DIAGNOSIS — J358 Other chronic diseases of tonsils and adenoids: Secondary | ICD-10-CM | POA: Diagnosis not present

## 2020-07-08 DIAGNOSIS — Z1159 Encounter for screening for other viral diseases: Secondary | ICD-10-CM | POA: Diagnosis not present

## 2020-07-12 ENCOUNTER — Other Ambulatory Visit: Payer: Self-pay | Admitting: Gastroenterology

## 2020-07-12 ENCOUNTER — Other Ambulatory Visit (HOSPITAL_COMMUNITY)
Admission: RE | Admit: 2020-07-12 | Discharge: 2020-07-12 | Disposition: A | Discharge status: Home / Self Care | Payer: Medicare PPO | Source: Ambulatory Visit | Attending: Gastroenterology | Admitting: Gastroenterology

## 2020-07-12 ENCOUNTER — Other Ambulatory Visit: Payer: Self-pay

## 2020-07-12 DIAGNOSIS — Z20822 Contact with and (suspected) exposure to covid-19: Secondary | ICD-10-CM | POA: Diagnosis not present

## 2020-07-12 DIAGNOSIS — Z01812 Encounter for preprocedural laboratory examination: Secondary | ICD-10-CM | POA: Insufficient documentation

## 2020-07-12 LAB — SARS CORONAVIRUS 2 (TAT 6-24 HRS): SARS Coronavirus 2: NEGATIVE

## 2020-07-12 NOTE — Progress Notes (Signed)
Attempted to obtain medical history via telephone, unable to reach at this time. I left a voicemail to return pre surgical testing department's phone call.  

## 2020-07-13 ENCOUNTER — Encounter (HOSPITAL_COMMUNITY)
Admission: RE | Disposition: A | Discharge status: Home / Self Care | Payer: Self-pay | Source: Ambulatory Visit | Attending: Gastroenterology

## 2020-07-13 ENCOUNTER — Ambulatory Visit (HOSPITAL_COMMUNITY): Payer: Medicare PPO | Admitting: Certified Registered Nurse Anesthetist

## 2020-07-13 ENCOUNTER — Ambulatory Visit (HOSPITAL_COMMUNITY)
Admission: RE | Admit: 2020-07-13 | Discharge: 2020-07-13 | Disposition: A | Discharge status: Home / Self Care | Payer: Medicare PPO | Source: Ambulatory Visit | Attending: Gastroenterology | Admitting: Gastroenterology

## 2020-07-13 ENCOUNTER — Encounter (HOSPITAL_COMMUNITY): Payer: Self-pay | Admitting: Gastroenterology

## 2020-07-13 ENCOUNTER — Other Ambulatory Visit: Payer: Self-pay

## 2020-07-13 DIAGNOSIS — Q438 Other specified congenital malformations of intestine: Secondary | ICD-10-CM | POA: Insufficient documentation

## 2020-07-13 DIAGNOSIS — Z800 Family history of malignant neoplasm of digestive organs: Secondary | ICD-10-CM | POA: Insufficient documentation

## 2020-07-13 DIAGNOSIS — G4730 Sleep apnea, unspecified: Secondary | ICD-10-CM | POA: Diagnosis not present

## 2020-07-13 DIAGNOSIS — Z1211 Encounter for screening for malignant neoplasm of colon: Secondary | ICD-10-CM | POA: Insufficient documentation

## 2020-07-13 DIAGNOSIS — Z6841 Body Mass Index (BMI) 40.0 and over, adult: Secondary | ICD-10-CM | POA: Insufficient documentation

## 2020-07-13 DIAGNOSIS — I10 Essential (primary) hypertension: Secondary | ICD-10-CM | POA: Diagnosis not present

## 2020-07-13 DIAGNOSIS — K648 Other hemorrhoids: Secondary | ICD-10-CM | POA: Diagnosis not present

## 2020-07-13 DIAGNOSIS — Z7984 Long term (current) use of oral hypoglycemic drugs: Secondary | ICD-10-CM | POA: Insufficient documentation

## 2020-07-13 DIAGNOSIS — E119 Type 2 diabetes mellitus without complications: Secondary | ICD-10-CM | POA: Insufficient documentation

## 2020-07-13 DIAGNOSIS — K219 Gastro-esophageal reflux disease without esophagitis: Secondary | ICD-10-CM | POA: Diagnosis not present

## 2020-07-13 HISTORY — PX: COLONOSCOPY WITH PROPOFOL: SHX5780

## 2020-07-13 SURGERY — COLONOSCOPY WITH PROPOFOL
Anesthesia: Monitor Anesthesia Care

## 2020-07-13 MED ORDER — SODIUM CHLORIDE 0.9 % IV SOLN: INTRAVENOUS | Status: DC

## 2020-07-13 MED ORDER — LIDOCAINE 2% (20 MG/ML) 5 ML SYRINGE
INTRAMUSCULAR | Status: DC | PRN
  Administered 2020-07-13: 50 mg via INTRAVENOUS

## 2020-07-13 MED ORDER — PROPOFOL 10 MG/ML IV BOLUS
INTRAVENOUS | Status: DC | PRN
  Administered 2020-07-13: 20 mg via INTRAVENOUS
  Administered 2020-07-13 (×2): 10 mg via INTRAVENOUS
  Administered 2020-07-13: 20 mg via INTRAVENOUS

## 2020-07-13 MED ORDER — LACTATED RINGERS IV SOLN: INTRAVENOUS | Status: DC

## 2020-07-13 MED ORDER — PROPOFOL 10 MG/ML IV BOLUS
INTRAVENOUS | Status: AC
  Filled 2020-07-13: qty 20

## 2020-07-13 MED ORDER — PROPOFOL 500 MG/50ML IV EMUL
INTRAVENOUS | Status: DC | PRN
  Administered 2020-07-13: 150 ug/kg/min via INTRAVENOUS

## 2020-07-13 SURGICAL SUPPLY — 22 items

## 2020-07-13 NOTE — Anesthesia Procedure Notes (Signed)
Procedure Name: MAC Date/Time: 07/13/2020 1:46 PM Performed by: Maxwell Caul, CRNA Pre-anesthesia Checklist: Patient identified, Emergency Drugs available, Suction available and Patient being monitored Oxygen Delivery Method: Simple face mask

## 2020-07-13 NOTE — Discharge Instructions (Signed)
YOU HAD AN ENDOSCOPIC PROCEDURE TODAY: Refer to the procedure report and other information in the discharge instructions given to you for any specific questions about what was found during the examination. If this information does not answer your questions, please call [facility] office at [phone] to clarify.   YOU SHOULD EXPECT: Some feelings of bloating in the abdomen. Passage of more gas than usual. Walking can help get rid of the air that was put into your GI tract during the procedure and reduce the bloating. If you had a lower endoscopy (such as a colonoscopy or flexible sigmoidoscopy) you may notice spotting of blood in your stool or on the toilet paper. Some abdominal soreness may be present for a day or two, also.  DIET: Your first meal following the procedure should be a light meal and then it is ok to progress to your normal diet. A half-sandwich or bowl of soup is an example of a good first meal. Heavy or fried foods are harder to digest and may make you feel nauseous or bloated. Drink plenty of fluids but you should avoid alcoholic beverages for 24 hours. If you had a esophageal dilation, please see attached instructions for diet.    ACTIVITY: Your care partner should take you home directly after the procedure. You should plan to take it easy, moving slowly for the rest of the day. You can resume normal activity the day after the procedure however YOU SHOULD NOT DRIVE, use power tools, machinery or perform tasks that involve climbing or major physical exertion for 24 hours (because of the sedation medicines used during the test).   SYMPTOMS TO REPORT IMMEDIATELY: A gastroenterologist can be reached at any hour. Please call [phone]  for any of the following symptoms:   Following lower endoscopy (colonoscopy, flexible sigmoidoscopy) Excessive amounts of blood in the stool  Significant tenderness, worsening of abdominal pains  Swelling of the abdomen that is new, acute  Fever of 100  or higher  Black, tarry-looking or red, bloody stools  FOLLOW UP:  If any biopsies were taken you will be contacted by phone or by letter within the next 1-3 weeks. Call [phone]  if you have not heard about the biopsies in 3 weeks.  Please also call with any specific questions about appointments or follow up tests. Call if question or problem otherwise repeat colon in 5 years

## 2020-07-13 NOTE — Op Note (Signed)
[facility] Patient Name: [name] Procedure Date: 07/13/2020 MRN: [id number] Attending MD: Clarene Essex , MD Date of Birth: 10/13/1960 CSN: [id number] Age: 59 Admit Type: Outpatient Procedure:                Colonoscopy Indications:              Last colonoscopy 5 years ago, Family history of                            colon cancer in a first-degree relative age 41 years Providers:                Clarene Essex, MD, Carmie End, RN, Erenest Rasher,                            RN, Elspeth Cho Tech., Technician, Lesia Sago, Technician Referring MD:              Medicines:                Propofol total dose 720 mg IV, 100 mg IV lidocaine Complications:            No immediate complications. Estimated Blood Loss:     Estimated blood loss: none. Procedure:                Pre-Anesthesia Assessment:                           - Prior to the procedure, a History and Physical                            was performed, and patient medications and                            allergies were reviewed. The patient's tolerance of                            previous anesthesia was also reviewed. The risks                            and benefits of the procedure and the sedation                            options and risks were discussed with the patient.                            All questions were answered, and informed consent                            was obtained. Prior Anticoagulants: The patient has                            taken no previous anticoagulant or antiplatelet  agents. ASA Grade Assessment: III - A patient with                            severe systemic disease. After reviewing the risks                            and benefits, the patient was deemed in                            satisfactory condition to undergo the procedure.                           After obtaining informed consent, the colonoscope                             was passed under direct vision. Throughout the                            procedure, the patient's blood pressure, pulse, and                            oxygen saturations were monitored continuously. The                            CF-HQ190L ([id number]) Olympus colonoscope was                            introduced through the anus and advanced to the the                            cecum, identified by appendiceal orifice and                            ileocecal valve. The ileocecal valve, appendiceal                            orifice, and rectum were photographed. The                            colonoscopy was somewhat difficult due to                            significant looping, a tortuous colon and the                            patient's body habitus. Successful completion of                            the procedure was aided by applying abdominal                            pressure. The patient tolerated the procedure well.  The quality of the bowel preparation was adequate. Scope In: 1:53:54 PM Scope Out: 2:17:38 PM Scope Withdrawal Time: 0 hours 12 minutes 37 seconds  Total Procedure Duration: 0 hours 23 minutes 44 seconds  Findings:      Internal hemorrhoids were found during retroflexion, during perianal       exam and during digital exam. The hemorrhoids were small.      The exam was otherwise without abnormality. Impression:               - Internal hemorrhoids.                           - The examination was otherwise normal.                           - No specimens collected. Moderate Sedation:      Not Applicable - Patient had care per Anesthesia. Recommendation:           - Patient has a contact number available for                            emergencies. The signs and symptoms of potential                            delayed complications were discussed with the                            patient. Return to normal  activities tomorrow.                            Written discharge instructions were provided to the                            patient.                           - Soft diet today.                           - Continue present medications.                           - Repeat colonoscopy in 5 years for screening                            purposes.                           - Return to GI office PRN.                           - Telephone GI clinic if symptomatic PRN. Procedure Code(s):        --- Professional ---                           [phone], Colonoscopy, flexible; diagnostic, including  collection of specimen(s) by brushing or washing,                            when performed (separate procedure) Diagnosis Code(s):        --- Professional ---                           Z80.0, Family history of malignant neoplasm of                            digestive organs CPT copyright 2019 [employer] Medical Association. All rights reserved. The codes documented in this report are preliminary and upon coder review may  be revised to meet current compliance requirements. Clarene Essex, MD 07/13/2020 2:23:51 PM This report has been signed electronically. Number of Addenda: 0

## 2020-07-13 NOTE — Progress Notes (Signed)
[name] 1:40 PM  Subjective: Patient without any GI complaints and no new problems since she was seen recently in our office and her previous procedure was reviewed  Objective: Vital signs stable afebrile no acute distress exam please see preassessment evaluation  Assessment: Family history of colon cancer  Plan: Okay to proceed with colonoscopy with anesthesia assistance with further work-up plans and future screening pending those findings  [facility]  office [phone] After 5PM or if no answer call [phone]

## 2020-07-13 NOTE — Anesthesia Preprocedure Evaluation (Addendum)
Anesthesia Evaluation  Patient identified by MRN, date of birth, ID band Patient awake    Reviewed: Allergy & Precautions, NPO status , Patient's Chart, lab work & pertinent test results  Airway Mallampati: II  TM Distance: >3 FB Neck ROM: Full    Dental  (+) Missing, Dental Advisory Given,    Pulmonary sleep apnea ,    breath sounds clear to auscultation       Cardiovascular hypertension, Pt. on medications  Rhythm:Regular Rate:Normal     Neuro/Psych negative neurological ROS     GI/Hepatic Neg liver ROS, GERD  Medicated,  Endo/Other  diabetes, Type 2, Oral Hypoglycemic AgentsMorbid obesity  Renal/GU negative Renal ROS     Musculoskeletal  (+) Arthritis , Rheumatoid disorders,    Abdominal (+) + obese,   Peds  Hematology negative hematology ROS (+)   Anesthesia Other Findings   Reproductive/Obstetrics                            Anesthesia Physical  Anesthesia Plan  ASA: III  Anesthesia Plan: MAC   Post-op Pain Management:    Induction: Intravenous  PONV Risk Score and Plan: 2 and Ondansetron, Propofol infusion and Treatment may vary due to age or medical condition  Airway Management Planned: Natural Airway  Additional Equipment: None  Intra-op Plan:   Post-operative Plan:   Informed Consent: I have reviewed the patients History and Physical, chart, labs and discussed the procedure including the risks, benefits and alternatives for the proposed anesthesia with the patient or authorized representative who has indicated his/her understanding and acceptance.     Dental advisory given  Plan Discussed with: CRNA  Anesthesia Plan Comments:       Anesthesia Quick Evaluation

## 2020-07-13 NOTE — Transfer of Care (Signed)
Immediate Anesthesia Transfer of Care Note  Patient: DOCIE ABRAMOVICH  Procedure(s) Performed: COLONOSCOPY WITH PROPOFOL (N/A )  Patient Location: PACU and Endoscopy Unit  Anesthesia Type:MAC  Level of Consciousness: awake, alert  and oriented  Airway & Oxygen Therapy: Patient Spontanous Breathing and Patient connected to face mask oxygen  Post-op Assessment: Report given to RN and Post -op Vital signs reviewed and stable  Post vital signs: Reviewed and stable  Last Vitals:  Vitals Value Taken Time  BP    Temp    Pulse    Resp    SpO2      Last Pain:  Vitals:   07/13/20 1316  TempSrc: Oral  PainSc: 0-No pain         Complications: No complications documented.

## 2020-07-14 ENCOUNTER — Encounter (HOSPITAL_COMMUNITY): Payer: Self-pay | Admitting: Gastroenterology

## 2020-07-14 NOTE — Anesthesia Postprocedure Evaluation (Signed)
Anesthesia Post Note  Patient: [name]  Procedure(s) Performed: COLONOSCOPY WITH PROPOFOL (N/A )     Patient location during evaluation: PACU Anesthesia Type: MAC Level of consciousness: awake and alert Pain management: pain level controlled Vital Signs Assessment: post-procedure vital signs reviewed and stable Respiratory status: spontaneous breathing Cardiovascular status: stable Anesthetic complications: no   No complications documented.  Last Vitals:  Vitals:   07/13/20 1440 07/13/20 1450  BP: (!) 148/100 (!) 168/84  Pulse: 69 63  Resp: 16 12  Temp:    SpO2: 100% 100%    Last Pain:  Vitals:   07/13/20 1450  TempSrc:   PainSc: 0-No pain                 Nolon Nations

## 2020-07-19 ENCOUNTER — Ambulatory Visit: Payer: Medicare PPO | Attending: Internal Medicine

## 2020-07-19 DIAGNOSIS — Z23 Encounter for immunization: Secondary | ICD-10-CM

## 2020-07-19 NOTE — Progress Notes (Signed)
   Covid-19 Vaccination Clinic  Name:  [name]    MRN: [id number] DOB: 06/23/1961  07/19/2020  [name] was observed post Covid-19 immunization for 15 minutes without incident. She was provided with Vaccine Information Sheet and instruction to access the V-Safe system.   [name] was instructed to call 911 with any severe reactions post vaccine: Marland Kitchen Difficulty breathing  . Swelling of face and throat  . A fast heartbeat  . A bad rash all over body  . Dizziness and weakness

## 2020-08-26 DIAGNOSIS — I10 Essential (primary) hypertension: Secondary | ICD-10-CM | POA: Diagnosis not present

## 2020-08-26 DIAGNOSIS — K219 Gastro-esophageal reflux disease without esophagitis: Secondary | ICD-10-CM | POA: Diagnosis not present

## 2020-08-26 DIAGNOSIS — M0579 Rheumatoid arthritis with rheumatoid factor of multiple sites without organ or systems involvement: Secondary | ICD-10-CM | POA: Diagnosis not present

## 2020-08-26 DIAGNOSIS — E559 Vitamin D deficiency, unspecified: Secondary | ICD-10-CM | POA: Diagnosis not present

## 2020-08-26 DIAGNOSIS — R7309 Other abnormal glucose: Secondary | ICD-10-CM | POA: Diagnosis not present

## 2020-08-26 DIAGNOSIS — J309 Allergic rhinitis, unspecified: Secondary | ICD-10-CM | POA: Diagnosis not present

## 2020-08-26 DIAGNOSIS — D638 Anemia in other chronic diseases classified elsewhere: Secondary | ICD-10-CM | POA: Diagnosis not present

## 2020-08-31 DIAGNOSIS — Z79899 Other long term (current) drug therapy: Secondary | ICD-10-CM | POA: Diagnosis not present

## 2020-08-31 DIAGNOSIS — M0579 Rheumatoid arthritis with rheumatoid factor of multiple sites without organ or systems involvement: Secondary | ICD-10-CM | POA: Diagnosis not present

## 2020-08-31 DIAGNOSIS — J309 Allergic rhinitis, unspecified: Secondary | ICD-10-CM | POA: Diagnosis not present

## 2020-08-31 DIAGNOSIS — I10 Essential (primary) hypertension: Secondary | ICD-10-CM | POA: Diagnosis not present

## 2020-08-31 DIAGNOSIS — D638 Anemia in other chronic diseases classified elsewhere: Secondary | ICD-10-CM | POA: Diagnosis not present

## 2020-08-31 DIAGNOSIS — E559 Vitamin D deficiency, unspecified: Secondary | ICD-10-CM | POA: Diagnosis not present

## 2020-08-31 DIAGNOSIS — R7303 Prediabetes: Secondary | ICD-10-CM | POA: Diagnosis not present

## 2020-08-31 DIAGNOSIS — K219 Gastro-esophageal reflux disease without esophagitis: Secondary | ICD-10-CM | POA: Diagnosis not present

## 2020-09-02 DIAGNOSIS — Z79899 Other long term (current) drug therapy: Secondary | ICD-10-CM | POA: Diagnosis not present

## 2020-09-02 DIAGNOSIS — Z6841 Body Mass Index (BMI) 40.0 and over, adult: Secondary | ICD-10-CM | POA: Diagnosis not present

## 2020-09-02 DIAGNOSIS — M0579 Rheumatoid arthritis with rheumatoid factor of multiple sites without organ or systems involvement: Secondary | ICD-10-CM | POA: Diagnosis not present

## 2020-09-02 DIAGNOSIS — M2550 Pain in unspecified joint: Secondary | ICD-10-CM | POA: Diagnosis not present

## 2020-09-02 DIAGNOSIS — M150 Primary generalized (osteo)arthritis: Secondary | ICD-10-CM | POA: Diagnosis not present

## 2020-11-11 DIAGNOSIS — H2513 Age-related nuclear cataract, bilateral: Secondary | ICD-10-CM | POA: Diagnosis not present

## 2020-11-11 DIAGNOSIS — M069 Rheumatoid arthritis, unspecified: Secondary | ICD-10-CM | POA: Diagnosis not present

## 2020-11-11 DIAGNOSIS — H0102A Squamous blepharitis right eye, upper and lower eyelids: Secondary | ICD-10-CM | POA: Diagnosis not present

## 2020-11-11 DIAGNOSIS — H11153 Pinguecula, bilateral: Secondary | ICD-10-CM | POA: Diagnosis not present

## 2020-11-11 DIAGNOSIS — H04123 Dry eye syndrome of bilateral lacrimal glands: Secondary | ICD-10-CM | POA: Diagnosis not present

## 2020-11-11 DIAGNOSIS — E119 Type 2 diabetes mellitus without complications: Secondary | ICD-10-CM | POA: Diagnosis not present

## 2020-11-11 DIAGNOSIS — Z79899 Other long term (current) drug therapy: Secondary | ICD-10-CM | POA: Diagnosis not present

## 2020-11-11 DIAGNOSIS — H40012 Open angle with borderline findings, low risk, left eye: Secondary | ICD-10-CM | POA: Diagnosis not present

## 2020-11-11 DIAGNOSIS — H0102B Squamous blepharitis left eye, upper and lower eyelids: Secondary | ICD-10-CM | POA: Diagnosis not present

## 2020-11-14 DIAGNOSIS — T148XXA Other injury of unspecified body region, initial encounter: Secondary | ICD-10-CM | POA: Diagnosis not present

## 2020-11-23 DIAGNOSIS — M542 Cervicalgia: Secondary | ICD-10-CM | POA: Diagnosis not present

## 2020-11-23 DIAGNOSIS — M62838 Other muscle spasm: Secondary | ICD-10-CM | POA: Diagnosis not present

## 2020-11-23 DIAGNOSIS — I10 Essential (primary) hypertension: Secondary | ICD-10-CM | POA: Diagnosis not present

## 2020-12-01 DIAGNOSIS — Z6841 Body Mass Index (BMI) 40.0 and over, adult: Secondary | ICD-10-CM | POA: Diagnosis not present

## 2020-12-01 DIAGNOSIS — M0579 Rheumatoid arthritis with rheumatoid factor of multiple sites without organ or systems involvement: Secondary | ICD-10-CM | POA: Diagnosis not present

## 2020-12-01 DIAGNOSIS — Z79899 Other long term (current) drug therapy: Secondary | ICD-10-CM | POA: Diagnosis not present

## 2020-12-01 DIAGNOSIS — M2550 Pain in unspecified joint: Secondary | ICD-10-CM | POA: Diagnosis not present

## 2020-12-01 DIAGNOSIS — M150 Primary generalized (osteo)arthritis: Secondary | ICD-10-CM | POA: Diagnosis not present

## 2020-12-01 DIAGNOSIS — M542 Cervicalgia: Secondary | ICD-10-CM | POA: Diagnosis not present

## 2020-12-09 DIAGNOSIS — M542 Cervicalgia: Secondary | ICD-10-CM | POA: Diagnosis not present

## 2020-12-09 DIAGNOSIS — M62838 Other muscle spasm: Secondary | ICD-10-CM | POA: Diagnosis not present

## 2020-12-17 DIAGNOSIS — S8001XA Contusion of right knee, initial encounter: Secondary | ICD-10-CM | POA: Diagnosis not present

## 2020-12-17 DIAGNOSIS — Z96653 Presence of artificial knee joint, bilateral: Secondary | ICD-10-CM | POA: Diagnosis not present

## 2020-12-17 DIAGNOSIS — M25561 Pain in right knee: Secondary | ICD-10-CM | POA: Diagnosis not present

## 2021-01-24 DIAGNOSIS — R350 Frequency of micturition: Secondary | ICD-10-CM | POA: Diagnosis not present

## 2021-02-17 DIAGNOSIS — R350 Frequency of micturition: Secondary | ICD-10-CM | POA: Diagnosis not present

## 2021-02-17 DIAGNOSIS — E559 Vitamin D deficiency, unspecified: Secondary | ICD-10-CM | POA: Diagnosis not present

## 2021-02-17 DIAGNOSIS — M0579 Rheumatoid arthritis with rheumatoid factor of multiple sites without organ or systems involvement: Secondary | ICD-10-CM | POA: Diagnosis not present

## 2021-02-17 DIAGNOSIS — D638 Anemia in other chronic diseases classified elsewhere: Secondary | ICD-10-CM | POA: Diagnosis not present

## 2021-02-17 DIAGNOSIS — J309 Allergic rhinitis, unspecified: Secondary | ICD-10-CM | POA: Diagnosis not present

## 2021-02-17 DIAGNOSIS — I10 Essential (primary) hypertension: Secondary | ICD-10-CM | POA: Diagnosis not present

## 2021-02-17 DIAGNOSIS — Z79899 Other long term (current) drug therapy: Secondary | ICD-10-CM | POA: Diagnosis not present

## 2021-02-17 DIAGNOSIS — K219 Gastro-esophageal reflux disease without esophagitis: Secondary | ICD-10-CM | POA: Diagnosis not present

## 2021-02-17 DIAGNOSIS — R7309 Other abnormal glucose: Secondary | ICD-10-CM | POA: Diagnosis not present

## 2021-02-24 DIAGNOSIS — K219 Gastro-esophageal reflux disease without esophagitis: Secondary | ICD-10-CM | POA: Diagnosis not present

## 2021-02-24 DIAGNOSIS — Z79899 Other long term (current) drug therapy: Secondary | ICD-10-CM | POA: Diagnosis not present

## 2021-02-24 DIAGNOSIS — I10 Essential (primary) hypertension: Secondary | ICD-10-CM | POA: Diagnosis not present

## 2021-02-24 DIAGNOSIS — M0579 Rheumatoid arthritis with rheumatoid factor of multiple sites without organ or systems involvement: Secondary | ICD-10-CM | POA: Diagnosis not present

## 2021-02-24 DIAGNOSIS — D638 Anemia in other chronic diseases classified elsewhere: Secondary | ICD-10-CM | POA: Diagnosis not present

## 2021-02-24 DIAGNOSIS — Z23 Encounter for immunization: Secondary | ICD-10-CM | POA: Diagnosis not present

## 2021-02-24 DIAGNOSIS — M542 Cervicalgia: Secondary | ICD-10-CM | POA: Diagnosis not present

## 2021-02-24 DIAGNOSIS — J309 Allergic rhinitis, unspecified: Secondary | ICD-10-CM | POA: Diagnosis not present

## 2021-02-24 DIAGNOSIS — R7303 Prediabetes: Secondary | ICD-10-CM | POA: Diagnosis not present

## 2021-03-03 DIAGNOSIS — Z79899 Other long term (current) drug therapy: Secondary | ICD-10-CM | POA: Diagnosis not present

## 2021-03-03 DIAGNOSIS — M542 Cervicalgia: Secondary | ICD-10-CM | POA: Diagnosis not present

## 2021-03-03 DIAGNOSIS — M2550 Pain in unspecified joint: Secondary | ICD-10-CM | POA: Diagnosis not present

## 2021-03-03 DIAGNOSIS — M150 Primary generalized (osteo)arthritis: Secondary | ICD-10-CM | POA: Diagnosis not present

## 2021-03-03 DIAGNOSIS — Z6841 Body Mass Index (BMI) 40.0 and over, adult: Secondary | ICD-10-CM | POA: Diagnosis not present

## 2021-03-03 DIAGNOSIS — M0579 Rheumatoid arthritis with rheumatoid factor of multiple sites without organ or systems involvement: Secondary | ICD-10-CM | POA: Diagnosis not present

## 2021-03-16 DIAGNOSIS — Z1389 Encounter for screening for other disorder: Secondary | ICD-10-CM | POA: Diagnosis not present

## 2021-03-16 DIAGNOSIS — Z0000 Encounter for general adult medical examination without abnormal findings: Secondary | ICD-10-CM | POA: Diagnosis not present

## 2021-06-03 DIAGNOSIS — R5383 Other fatigue: Secondary | ICD-10-CM | POA: Diagnosis not present

## 2021-06-03 DIAGNOSIS — Z79899 Other long term (current) drug therapy: Secondary | ICD-10-CM | POA: Diagnosis not present

## 2021-06-03 DIAGNOSIS — M0579 Rheumatoid arthritis with rheumatoid factor of multiple sites without organ or systems involvement: Secondary | ICD-10-CM | POA: Diagnosis not present

## 2021-08-31 DIAGNOSIS — J309 Allergic rhinitis, unspecified: Secondary | ICD-10-CM | POA: Diagnosis not present

## 2021-08-31 DIAGNOSIS — I10 Essential (primary) hypertension: Secondary | ICD-10-CM | POA: Diagnosis not present

## 2021-08-31 DIAGNOSIS — K219 Gastro-esophageal reflux disease without esophagitis: Secondary | ICD-10-CM | POA: Diagnosis not present

## 2021-08-31 DIAGNOSIS — M542 Cervicalgia: Secondary | ICD-10-CM | POA: Diagnosis not present

## 2021-08-31 DIAGNOSIS — R7309 Other abnormal glucose: Secondary | ICD-10-CM | POA: Diagnosis not present

## 2021-08-31 DIAGNOSIS — Z79899 Other long term (current) drug therapy: Secondary | ICD-10-CM | POA: Diagnosis not present

## 2021-08-31 DIAGNOSIS — M0579 Rheumatoid arthritis with rheumatoid factor of multiple sites without organ or systems involvement: Secondary | ICD-10-CM | POA: Diagnosis not present

## 2021-08-31 DIAGNOSIS — Z23 Encounter for immunization: Secondary | ICD-10-CM | POA: Diagnosis not present

## 2021-08-31 DIAGNOSIS — D638 Anemia in other chronic diseases classified elsewhere: Secondary | ICD-10-CM | POA: Diagnosis not present

## 2021-09-02 DIAGNOSIS — M2550 Pain in unspecified joint: Secondary | ICD-10-CM | POA: Diagnosis not present

## 2021-09-02 DIAGNOSIS — M0579 Rheumatoid arthritis with rheumatoid factor of multiple sites without organ or systems involvement: Secondary | ICD-10-CM | POA: Diagnosis not present

## 2021-09-02 DIAGNOSIS — Z6841 Body Mass Index (BMI) 40.0 and over, adult: Secondary | ICD-10-CM | POA: Diagnosis not present

## 2021-09-02 DIAGNOSIS — M150 Primary generalized (osteo)arthritis: Secondary | ICD-10-CM | POA: Diagnosis not present

## 2021-09-02 DIAGNOSIS — Z79899 Other long term (current) drug therapy: Secondary | ICD-10-CM | POA: Diagnosis not present

## 2021-09-08 DIAGNOSIS — G4733 Obstructive sleep apnea (adult) (pediatric): Secondary | ICD-10-CM | POA: Diagnosis not present

## 2021-09-08 DIAGNOSIS — R7303 Prediabetes: Secondary | ICD-10-CM | POA: Diagnosis not present

## 2021-09-08 DIAGNOSIS — K219 Gastro-esophageal reflux disease without esophagitis: Secondary | ICD-10-CM | POA: Diagnosis not present

## 2021-09-08 DIAGNOSIS — E559 Vitamin D deficiency, unspecified: Secondary | ICD-10-CM | POA: Diagnosis not present

## 2021-09-08 DIAGNOSIS — I10 Essential (primary) hypertension: Secondary | ICD-10-CM | POA: Diagnosis not present

## 2021-09-08 DIAGNOSIS — Z23 Encounter for immunization: Secondary | ICD-10-CM | POA: Diagnosis not present

## 2021-09-08 DIAGNOSIS — N393 Stress incontinence (female) (male): Secondary | ICD-10-CM | POA: Diagnosis not present

## 2021-09-08 DIAGNOSIS — I872 Venous insufficiency (chronic) (peripheral): Secondary | ICD-10-CM | POA: Diagnosis not present

## 2021-10-24 DIAGNOSIS — J3489 Other specified disorders of nose and nasal sinuses: Secondary | ICD-10-CM | POA: Diagnosis not present

## 2021-10-24 DIAGNOSIS — J019 Acute sinusitis, unspecified: Secondary | ICD-10-CM | POA: Diagnosis not present

## 2021-11-16 DIAGNOSIS — H40012 Open angle with borderline findings, low risk, left eye: Secondary | ICD-10-CM | POA: Diagnosis not present

## 2021-11-16 DIAGNOSIS — E119 Type 2 diabetes mellitus without complications: Secondary | ICD-10-CM | POA: Diagnosis not present

## 2021-11-16 DIAGNOSIS — H2513 Age-related nuclear cataract, bilateral: Secondary | ICD-10-CM | POA: Diagnosis not present

## 2021-11-16 DIAGNOSIS — H0102B Squamous blepharitis left eye, upper and lower eyelids: Secondary | ICD-10-CM | POA: Diagnosis not present

## 2021-11-16 DIAGNOSIS — H04123 Dry eye syndrome of bilateral lacrimal glands: Secondary | ICD-10-CM | POA: Diagnosis not present

## 2021-11-16 DIAGNOSIS — H11153 Pinguecula, bilateral: Secondary | ICD-10-CM | POA: Diagnosis not present

## 2021-11-16 DIAGNOSIS — H0102A Squamous blepharitis right eye, upper and lower eyelids: Secondary | ICD-10-CM | POA: Diagnosis not present

## 2021-11-16 DIAGNOSIS — Z79899 Other long term (current) drug therapy: Secondary | ICD-10-CM | POA: Diagnosis not present

## 2021-11-16 DIAGNOSIS — M069 Rheumatoid arthritis, unspecified: Secondary | ICD-10-CM | POA: Diagnosis not present

## 2021-11-25 DIAGNOSIS — R059 Cough, unspecified: Secondary | ICD-10-CM | POA: Diagnosis not present

## 2021-11-25 DIAGNOSIS — J3089 Other allergic rhinitis: Secondary | ICD-10-CM | POA: Diagnosis not present

## 2021-11-25 DIAGNOSIS — K219 Gastro-esophageal reflux disease without esophagitis: Secondary | ICD-10-CM | POA: Diagnosis not present

## 2021-12-02 DIAGNOSIS — M0579 Rheumatoid arthritis with rheumatoid factor of multiple sites without organ or systems involvement: Secondary | ICD-10-CM | POA: Diagnosis not present

## 2022-01-10 DIAGNOSIS — Z20822 Contact with and (suspected) exposure to covid-19: Secondary | ICD-10-CM | POA: Diagnosis not present

## 2022-01-17 DIAGNOSIS — J309 Allergic rhinitis, unspecified: Secondary | ICD-10-CM | POA: Diagnosis not present

## 2022-01-17 DIAGNOSIS — K219 Gastro-esophageal reflux disease without esophagitis: Secondary | ICD-10-CM | POA: Diagnosis not present

## 2022-01-17 DIAGNOSIS — J4531 Mild persistent asthma with (acute) exacerbation: Secondary | ICD-10-CM | POA: Diagnosis not present

## 2022-03-03 DIAGNOSIS — M0579 Rheumatoid arthritis with rheumatoid factor of multiple sites without organ or systems involvement: Secondary | ICD-10-CM | POA: Diagnosis not present

## 2022-03-03 DIAGNOSIS — Z79899 Other long term (current) drug therapy: Secondary | ICD-10-CM | POA: Diagnosis not present

## 2022-03-03 DIAGNOSIS — Z6841 Body Mass Index (BMI) 40.0 and over, adult: Secondary | ICD-10-CM | POA: Diagnosis not present

## 2022-03-03 DIAGNOSIS — M1991 Primary osteoarthritis, unspecified site: Secondary | ICD-10-CM | POA: Diagnosis not present

## 2022-03-10 DIAGNOSIS — I10 Essential (primary) hypertension: Secondary | ICD-10-CM | POA: Diagnosis not present

## 2022-03-10 DIAGNOSIS — R7309 Other abnormal glucose: Secondary | ICD-10-CM | POA: Diagnosis not present

## 2022-03-10 DIAGNOSIS — Z79899 Other long term (current) drug therapy: Secondary | ICD-10-CM | POA: Diagnosis not present

## 2022-03-10 DIAGNOSIS — D509 Iron deficiency anemia, unspecified: Secondary | ICD-10-CM | POA: Diagnosis not present

## 2022-03-10 DIAGNOSIS — K219 Gastro-esophageal reflux disease without esophagitis: Secondary | ICD-10-CM | POA: Diagnosis not present

## 2022-03-14 DIAGNOSIS — K219 Gastro-esophageal reflux disease without esophagitis: Secondary | ICD-10-CM | POA: Diagnosis not present

## 2022-03-14 DIAGNOSIS — I10 Essential (primary) hypertension: Secondary | ICD-10-CM | POA: Diagnosis not present

## 2022-03-14 DIAGNOSIS — R7303 Prediabetes: Secondary | ICD-10-CM | POA: Diagnosis not present

## 2022-03-14 DIAGNOSIS — J4530 Mild persistent asthma, uncomplicated: Secondary | ICD-10-CM | POA: Diagnosis not present

## 2022-03-14 DIAGNOSIS — J309 Allergic rhinitis, unspecified: Secondary | ICD-10-CM | POA: Diagnosis not present

## 2022-03-14 DIAGNOSIS — M542 Cervicalgia: Secondary | ICD-10-CM | POA: Diagnosis not present

## 2022-03-14 DIAGNOSIS — M0579 Rheumatoid arthritis with rheumatoid factor of multiple sites without organ or systems involvement: Secondary | ICD-10-CM | POA: Diagnosis not present

## 2022-03-14 DIAGNOSIS — G4733 Obstructive sleep apnea (adult) (pediatric): Secondary | ICD-10-CM | POA: Diagnosis not present

## 2022-03-22 DIAGNOSIS — Z0000 Encounter for general adult medical examination without abnormal findings: Secondary | ICD-10-CM | POA: Diagnosis not present

## 2022-03-22 DIAGNOSIS — Z1389 Encounter for screening for other disorder: Secondary | ICD-10-CM | POA: Diagnosis not present

## 2022-04-05 DIAGNOSIS — I10 Essential (primary) hypertension: Secondary | ICD-10-CM | POA: Diagnosis not present

## 2022-04-05 DIAGNOSIS — G4733 Obstructive sleep apnea (adult) (pediatric): Secondary | ICD-10-CM | POA: Diagnosis not present

## 2022-06-01 DIAGNOSIS — Z1331 Encounter for screening for depression: Secondary | ICD-10-CM | POA: Diagnosis not present

## 2022-06-01 DIAGNOSIS — R7303 Prediabetes: Secondary | ICD-10-CM | POA: Diagnosis not present

## 2022-06-01 DIAGNOSIS — Z6841 Body Mass Index (BMI) 40.0 and over, adult: Secondary | ICD-10-CM | POA: Diagnosis not present

## 2022-06-01 DIAGNOSIS — R7301 Impaired fasting glucose: Secondary | ICD-10-CM | POA: Diagnosis not present

## 2022-06-01 DIAGNOSIS — I10 Essential (primary) hypertension: Secondary | ICD-10-CM | POA: Diagnosis not present

## 2022-06-01 DIAGNOSIS — G4733 Obstructive sleep apnea (adult) (pediatric): Secondary | ICD-10-CM | POA: Diagnosis not present

## 2022-06-08 DIAGNOSIS — M0579 Rheumatoid arthritis with rheumatoid factor of multiple sites without organ or systems involvement: Secondary | ICD-10-CM | POA: Diagnosis not present

## 2022-06-15 DIAGNOSIS — Z6841 Body Mass Index (BMI) 40.0 and over, adult: Secondary | ICD-10-CM | POA: Diagnosis not present

## 2022-06-15 DIAGNOSIS — K5909 Other constipation: Secondary | ICD-10-CM | POA: Diagnosis not present

## 2022-06-15 DIAGNOSIS — R7303 Prediabetes: Secondary | ICD-10-CM | POA: Diagnosis not present

## 2022-06-15 DIAGNOSIS — I10 Essential (primary) hypertension: Secondary | ICD-10-CM | POA: Diagnosis not present

## 2022-07-04 DIAGNOSIS — G4733 Obstructive sleep apnea (adult) (pediatric): Secondary | ICD-10-CM | POA: Diagnosis not present

## 2022-07-06 DIAGNOSIS — R7303 Prediabetes: Secondary | ICD-10-CM | POA: Diagnosis not present

## 2022-07-06 DIAGNOSIS — K219 Gastro-esophageal reflux disease without esophagitis: Secondary | ICD-10-CM | POA: Diagnosis not present

## 2022-07-06 DIAGNOSIS — Z6841 Body Mass Index (BMI) 40.0 and over, adult: Secondary | ICD-10-CM | POA: Diagnosis not present

## 2022-07-26 DIAGNOSIS — Z6841 Body Mass Index (BMI) 40.0 and over, adult: Secondary | ICD-10-CM | POA: Diagnosis not present

## 2022-07-26 DIAGNOSIS — K5903 Drug induced constipation: Secondary | ICD-10-CM | POA: Diagnosis not present

## 2022-07-26 DIAGNOSIS — R7303 Prediabetes: Secondary | ICD-10-CM | POA: Diagnosis not present

## 2022-08-07 ENCOUNTER — Ambulatory Visit (HOSPITAL_COMMUNITY)
Admission: EM | Admit: 2022-08-07 | Discharge: 2022-08-07 | Disposition: A | Discharge status: Home / Self Care | Payer: Medicare PPO | Attending: Family Medicine | Admitting: Family Medicine

## 2022-08-07 DIAGNOSIS — R42 Dizziness and giddiness: Secondary | ICD-10-CM | POA: Diagnosis not present

## 2022-08-07 DIAGNOSIS — L309 Dermatitis, unspecified: Secondary | ICD-10-CM | POA: Diagnosis not present

## 2022-08-07 DIAGNOSIS — J029 Acute pharyngitis, unspecified: Secondary | ICD-10-CM | POA: Diagnosis not present

## 2022-08-07 LAB — POCT RAPID STREP A, ED / UC: Streptococcus, Group A Screen (Direct): NEGATIVE

## 2022-08-07 MED ORDER — PREDNISONE 20 MG PO TABS: 40.0000 mg | ORAL_TABLET | Freq: Every day | ORAL | 0 refills | Status: AC

## 2022-08-07 NOTE — Discharge Instructions (Addendum)
Rapid strep is negative  Take prednisone 20 mg--2 daily for 3 days  Make an appointment to follow-up with your primary care about how you have been feeling
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
## 2022-08-07 NOTE — ED Triage Notes (Addendum)
Pt is here for light headed, numbness on the right side of arm. X2 days

## 2022-08-23 DIAGNOSIS — Z6841 Body Mass Index (BMI) 40.0 and over, adult: Secondary | ICD-10-CM | POA: Diagnosis not present

## 2022-08-23 DIAGNOSIS — R7303 Prediabetes: Secondary | ICD-10-CM | POA: Diagnosis not present

## 2022-08-23 DIAGNOSIS — I10 Essential (primary) hypertension: Secondary | ICD-10-CM | POA: Diagnosis not present

## 2022-08-30 DIAGNOSIS — Z6841 Body Mass Index (BMI) 40.0 and over, adult: Secondary | ICD-10-CM | POA: Diagnosis not present

## 2022-08-30 DIAGNOSIS — M1991 Primary osteoarthritis, unspecified site: Secondary | ICD-10-CM | POA: Diagnosis not present

## 2022-08-30 DIAGNOSIS — Z79899 Other long term (current) drug therapy: Secondary | ICD-10-CM | POA: Diagnosis not present

## 2022-08-30 DIAGNOSIS — M25519 Pain in unspecified shoulder: Secondary | ICD-10-CM | POA: Diagnosis not present

## 2022-08-30 DIAGNOSIS — M0579 Rheumatoid arthritis with rheumatoid factor of multiple sites without organ or systems involvement: Secondary | ICD-10-CM | POA: Diagnosis not present

## 2022-09-13 DIAGNOSIS — Z6841 Body Mass Index (BMI) 40.0 and over, adult: Secondary | ICD-10-CM | POA: Diagnosis not present

## 2022-09-13 DIAGNOSIS — I10 Essential (primary) hypertension: Secondary | ICD-10-CM | POA: Diagnosis not present

## 2022-09-13 DIAGNOSIS — R7303 Prediabetes: Secondary | ICD-10-CM | POA: Diagnosis not present

## 2022-09-21 DIAGNOSIS — R7309 Other abnormal glucose: Secondary | ICD-10-CM | POA: Diagnosis not present

## 2022-09-28 DIAGNOSIS — J4530 Mild persistent asthma, uncomplicated: Secondary | ICD-10-CM | POA: Diagnosis not present

## 2022-09-28 DIAGNOSIS — R7303 Prediabetes: Secondary | ICD-10-CM | POA: Diagnosis not present

## 2022-09-28 DIAGNOSIS — G4733 Obstructive sleep apnea (adult) (pediatric): Secondary | ICD-10-CM | POA: Diagnosis not present

## 2022-09-28 DIAGNOSIS — Z23 Encounter for immunization: Secondary | ICD-10-CM | POA: Diagnosis not present

## 2022-09-28 DIAGNOSIS — K219 Gastro-esophageal reflux disease without esophagitis: Secondary | ICD-10-CM | POA: Diagnosis not present

## 2022-09-28 DIAGNOSIS — I10 Essential (primary) hypertension: Secondary | ICD-10-CM | POA: Diagnosis not present

## 2022-09-28 DIAGNOSIS — M0579 Rheumatoid arthritis with rheumatoid factor of multiple sites without organ or systems involvement: Secondary | ICD-10-CM | POA: Diagnosis not present

## 2022-09-28 DIAGNOSIS — Z6841 Body Mass Index (BMI) 40.0 and over, adult: Secondary | ICD-10-CM | POA: Diagnosis not present

## 2022-10-11 DIAGNOSIS — R7303 Prediabetes: Secondary | ICD-10-CM | POA: Diagnosis not present

## 2022-10-11 DIAGNOSIS — Z6841 Body Mass Index (BMI) 40.0 and over, adult: Secondary | ICD-10-CM | POA: Diagnosis not present

## 2022-10-11 DIAGNOSIS — I10 Essential (primary) hypertension: Secondary | ICD-10-CM | POA: Diagnosis not present

## 2022-10-26 DIAGNOSIS — Z6841 Body Mass Index (BMI) 40.0 and over, adult: Secondary | ICD-10-CM | POA: Diagnosis not present

## 2022-10-26 DIAGNOSIS — M542 Cervicalgia: Secondary | ICD-10-CM | POA: Diagnosis not present

## 2022-10-29 DIAGNOSIS — G4733 Obstructive sleep apnea (adult) (pediatric): Secondary | ICD-10-CM | POA: Diagnosis not present

## 2022-10-31 DIAGNOSIS — R7303 Prediabetes: Secondary | ICD-10-CM | POA: Diagnosis not present

## 2022-10-31 DIAGNOSIS — I10 Essential (primary) hypertension: Secondary | ICD-10-CM | POA: Diagnosis not present

## 2022-10-31 DIAGNOSIS — Z6841 Body Mass Index (BMI) 40.0 and over, adult: Secondary | ICD-10-CM | POA: Diagnosis not present

## 2022-11-16 ENCOUNTER — Ambulatory Visit: Payer: Medicare PPO | Admitting: Podiatry

## 2022-11-16 ENCOUNTER — Encounter: Payer: Self-pay | Admitting: Podiatry

## 2022-11-16 DIAGNOSIS — L600 Ingrowing nail: Secondary | ICD-10-CM | POA: Diagnosis not present

## 2022-11-16 NOTE — Progress Notes (Signed)
Subjective:   Patient ID: [name], female   DOB: 62 y.o.   MRN: [id number]   HPI Patient presents with chronically thickened painful nailbeds big toes bilateral that have been bothersome she is tried to trim them soak them and has not achieved relief of her symptoms and states that she needs to have these taken off permanently due to the discomfort and new symptoms.  Patient does not smoke moderately obese and does try to be active   Review of Systems  All other systems reviewed and are negative.       Objective:  Physical Exam Vitals and nursing note reviewed.  Constitutional:      Appearance: She is well-developed.  Pulmonary:     Effort: Pulmonary effort is normal.  Musculoskeletal:        General: Normal range of motion.  Skin:    General: Skin is warm.  Neurological:     Mental Status: She is alert.     Neurovascular status intact muscle strength adequate range of motion within normal limits with thick yellow brittle nailbeds 1-5 both feet that are painful when pressed and make shoe gear difficult.  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  I infiltrated each big toe 60 mg like Marcaine mixture sterile prep done using sterile instrumentation remove the big toenails exposed matrix applied phenol for applications 30 seconds followed by alcohol lavage sterile dressing gave instructions on soaks and to wear dressings 24 hours take them off earlier if throbbing were to occur.  Encouraged her to call with questions concerns

## 2022-11-16 NOTE — Patient Instructions (Signed)

## 2022-11-20 DIAGNOSIS — R7303 Prediabetes: Secondary | ICD-10-CM | POA: Diagnosis not present

## 2022-11-20 DIAGNOSIS — Z6841 Body Mass Index (BMI) 40.0 and over, adult: Secondary | ICD-10-CM | POA: Diagnosis not present

## 2022-11-20 DIAGNOSIS — I10 Essential (primary) hypertension: Secondary | ICD-10-CM | POA: Diagnosis not present

## 2022-11-23 DIAGNOSIS — M069 Rheumatoid arthritis, unspecified: Secondary | ICD-10-CM | POA: Diagnosis not present

## 2022-11-23 DIAGNOSIS — H0102B Squamous blepharitis left eye, upper and lower eyelids: Secondary | ICD-10-CM | POA: Diagnosis not present

## 2022-11-23 DIAGNOSIS — H0102A Squamous blepharitis right eye, upper and lower eyelids: Secondary | ICD-10-CM | POA: Diagnosis not present

## 2022-11-23 DIAGNOSIS — H2513 Age-related nuclear cataract, bilateral: Secondary | ICD-10-CM | POA: Diagnosis not present

## 2022-11-23 DIAGNOSIS — H11153 Pinguecula, bilateral: Secondary | ICD-10-CM | POA: Diagnosis not present

## 2022-11-23 DIAGNOSIS — Z79899 Other long term (current) drug therapy: Secondary | ICD-10-CM | POA: Diagnosis not present

## 2022-11-23 DIAGNOSIS — H04123 Dry eye syndrome of bilateral lacrimal glands: Secondary | ICD-10-CM | POA: Diagnosis not present

## 2022-11-23 DIAGNOSIS — H40012 Open angle with borderline findings, low risk, left eye: Secondary | ICD-10-CM | POA: Diagnosis not present

## 2022-11-23 DIAGNOSIS — E119 Type 2 diabetes mellitus without complications: Secondary | ICD-10-CM | POA: Diagnosis not present

## 2022-11-27 DIAGNOSIS — H9201 Otalgia, right ear: Secondary | ICD-10-CM | POA: Diagnosis not present

## 2022-11-27 DIAGNOSIS — Z6841 Body Mass Index (BMI) 40.0 and over, adult: Secondary | ICD-10-CM | POA: Diagnosis not present

## 2022-11-27 DIAGNOSIS — G4733 Obstructive sleep apnea (adult) (pediatric): Secondary | ICD-10-CM | POA: Diagnosis not present

## 2022-11-28 DIAGNOSIS — G4733 Obstructive sleep apnea (adult) (pediatric): Secondary | ICD-10-CM | POA: Diagnosis not present

## 2022-11-29 DIAGNOSIS — M0579 Rheumatoid arthritis with rheumatoid factor of multiple sites without organ or systems involvement: Secondary | ICD-10-CM | POA: Diagnosis not present

## 2022-12-13 DIAGNOSIS — I10 Essential (primary) hypertension: Secondary | ICD-10-CM | POA: Diagnosis not present

## 2022-12-13 DIAGNOSIS — R7303 Prediabetes: Secondary | ICD-10-CM | POA: Diagnosis not present

## 2022-12-13 DIAGNOSIS — Z6841 Body Mass Index (BMI) 40.0 and over, adult: Secondary | ICD-10-CM | POA: Diagnosis not present

## 2022-12-28 DIAGNOSIS — G4733 Obstructive sleep apnea (adult) (pediatric): Secondary | ICD-10-CM | POA: Diagnosis not present

## 2023-01-03 DIAGNOSIS — Z6841 Body Mass Index (BMI) 40.0 and over, adult: Secondary | ICD-10-CM | POA: Diagnosis not present

## 2023-01-03 DIAGNOSIS — W57XXXA Bitten or stung by nonvenomous insect and other nonvenomous arthropods, initial encounter: Secondary | ICD-10-CM | POA: Diagnosis not present

## 2023-01-03 DIAGNOSIS — L299 Pruritus, unspecified: Secondary | ICD-10-CM | POA: Diagnosis not present

## 2023-01-09 DIAGNOSIS — R229 Localized swelling, mass and lump, unspecified: Secondary | ICD-10-CM | POA: Diagnosis not present

## 2023-01-09 DIAGNOSIS — Z6841 Body Mass Index (BMI) 40.0 and over, adult: Secondary | ICD-10-CM | POA: Diagnosis not present

## 2023-01-10 DIAGNOSIS — I10 Essential (primary) hypertension: Secondary | ICD-10-CM | POA: Diagnosis not present

## 2023-01-10 DIAGNOSIS — Z6841 Body Mass Index (BMI) 40.0 and over, adult: Secondary | ICD-10-CM | POA: Diagnosis not present

## 2023-01-10 DIAGNOSIS — R7303 Prediabetes: Secondary | ICD-10-CM | POA: Diagnosis not present

## 2023-01-27 DIAGNOSIS — G4733 Obstructive sleep apnea (adult) (pediatric): Secondary | ICD-10-CM | POA: Diagnosis not present

## 2023-01-31 DIAGNOSIS — Z6841 Body Mass Index (BMI) 40.0 and over, adult: Secondary | ICD-10-CM | POA: Diagnosis not present

## 2023-01-31 DIAGNOSIS — I10 Essential (primary) hypertension: Secondary | ICD-10-CM | POA: Diagnosis not present

## 2023-01-31 DIAGNOSIS — R7303 Prediabetes: Secondary | ICD-10-CM | POA: Diagnosis not present

## 2023-02-22 DIAGNOSIS — R7303 Prediabetes: Secondary | ICD-10-CM | POA: Diagnosis not present

## 2023-02-22 DIAGNOSIS — I10 Essential (primary) hypertension: Secondary | ICD-10-CM | POA: Diagnosis not present

## 2023-02-22 DIAGNOSIS — Z6841 Body Mass Index (BMI) 40.0 and over, adult: Secondary | ICD-10-CM | POA: Diagnosis not present

## 2023-02-27 DIAGNOSIS — G4733 Obstructive sleep apnea (adult) (pediatric): Secondary | ICD-10-CM | POA: Diagnosis not present

## 2023-02-28 DIAGNOSIS — M25512 Pain in left shoulder: Secondary | ICD-10-CM | POA: Diagnosis not present

## 2023-02-28 DIAGNOSIS — M0579 Rheumatoid arthritis with rheumatoid factor of multiple sites without organ or systems involvement: Secondary | ICD-10-CM | POA: Diagnosis not present

## 2023-02-28 DIAGNOSIS — Z6841 Body Mass Index (BMI) 40.0 and over, adult: Secondary | ICD-10-CM | POA: Diagnosis not present

## 2023-02-28 DIAGNOSIS — Z79899 Other long term (current) drug therapy: Secondary | ICD-10-CM | POA: Diagnosis not present

## 2023-02-28 DIAGNOSIS — M1991 Primary osteoarthritis, unspecified site: Secondary | ICD-10-CM | POA: Diagnosis not present

## 2023-03-22 DIAGNOSIS — R7303 Prediabetes: Secondary | ICD-10-CM | POA: Diagnosis not present

## 2023-03-22 DIAGNOSIS — I10 Essential (primary) hypertension: Secondary | ICD-10-CM | POA: Diagnosis not present

## 2023-03-22 DIAGNOSIS — Z6841 Body Mass Index (BMI) 40.0 and over, adult: Secondary | ICD-10-CM | POA: Diagnosis not present

## 2023-03-22 DIAGNOSIS — D509 Iron deficiency anemia, unspecified: Secondary | ICD-10-CM | POA: Diagnosis not present

## 2023-03-26 DIAGNOSIS — Z79899 Other long term (current) drug therapy: Secondary | ICD-10-CM | POA: Diagnosis not present

## 2023-03-26 DIAGNOSIS — D509 Iron deficiency anemia, unspecified: Secondary | ICD-10-CM | POA: Diagnosis not present

## 2023-03-26 DIAGNOSIS — Z1331 Encounter for screening for depression: Secondary | ICD-10-CM | POA: Diagnosis not present

## 2023-03-26 DIAGNOSIS — I10 Essential (primary) hypertension: Secondary | ICD-10-CM | POA: Diagnosis not present

## 2023-03-26 DIAGNOSIS — Z6841 Body Mass Index (BMI) 40.0 and over, adult: Secondary | ICD-10-CM | POA: Diagnosis not present

## 2023-03-26 DIAGNOSIS — Z0000 Encounter for general adult medical examination without abnormal findings: Secondary | ICD-10-CM | POA: Diagnosis not present

## 2023-03-26 DIAGNOSIS — R7309 Other abnormal glucose: Secondary | ICD-10-CM | POA: Diagnosis not present

## 2023-03-26 DIAGNOSIS — R7303 Prediabetes: Secondary | ICD-10-CM | POA: Diagnosis not present

## 2023-03-29 DIAGNOSIS — G4733 Obstructive sleep apnea (adult) (pediatric): Secondary | ICD-10-CM | POA: Diagnosis not present

## 2023-04-11 DIAGNOSIS — K219 Gastro-esophageal reflux disease without esophagitis: Secondary | ICD-10-CM | POA: Diagnosis not present

## 2023-04-11 DIAGNOSIS — D638 Anemia in other chronic diseases classified elsewhere: Secondary | ICD-10-CM | POA: Diagnosis not present

## 2023-04-11 DIAGNOSIS — M542 Cervicalgia: Secondary | ICD-10-CM | POA: Diagnosis not present

## 2023-04-11 DIAGNOSIS — M0579 Rheumatoid arthritis with rheumatoid factor of multiple sites without organ or systems involvement: Secondary | ICD-10-CM | POA: Diagnosis not present

## 2023-04-11 DIAGNOSIS — G4733 Obstructive sleep apnea (adult) (pediatric): Secondary | ICD-10-CM | POA: Diagnosis not present

## 2023-04-11 DIAGNOSIS — Z6841 Body Mass Index (BMI) 40.0 and over, adult: Secondary | ICD-10-CM | POA: Diagnosis not present

## 2023-04-11 DIAGNOSIS — R7303 Prediabetes: Secondary | ICD-10-CM | POA: Diagnosis not present

## 2023-04-11 DIAGNOSIS — I10 Essential (primary) hypertension: Secondary | ICD-10-CM | POA: Diagnosis not present

## 2023-04-24 DIAGNOSIS — D509 Iron deficiency anemia, unspecified: Secondary | ICD-10-CM | POA: Diagnosis not present

## 2023-04-24 DIAGNOSIS — R7303 Prediabetes: Secondary | ICD-10-CM | POA: Diagnosis not present

## 2023-04-24 DIAGNOSIS — I10 Essential (primary) hypertension: Secondary | ICD-10-CM | POA: Diagnosis not present

## 2023-04-24 DIAGNOSIS — Z6841 Body Mass Index (BMI) 40.0 and over, adult: Secondary | ICD-10-CM | POA: Diagnosis not present

## 2023-04-29 DIAGNOSIS — G4733 Obstructive sleep apnea (adult) (pediatric): Secondary | ICD-10-CM | POA: Diagnosis not present

## 2023-05-22 DIAGNOSIS — Z6841 Body Mass Index (BMI) 40.0 and over, adult: Secondary | ICD-10-CM | POA: Diagnosis not present

## 2023-05-22 DIAGNOSIS — I10 Essential (primary) hypertension: Secondary | ICD-10-CM | POA: Diagnosis not present

## 2023-05-22 DIAGNOSIS — D509 Iron deficiency anemia, unspecified: Secondary | ICD-10-CM | POA: Diagnosis not present

## 2023-05-22 DIAGNOSIS — R7303 Prediabetes: Secondary | ICD-10-CM | POA: Diagnosis not present

## 2023-05-29 DIAGNOSIS — G4733 Obstructive sleep apnea (adult) (pediatric): Secondary | ICD-10-CM | POA: Diagnosis not present

## 2023-05-30 DIAGNOSIS — G4733 Obstructive sleep apnea (adult) (pediatric): Secondary | ICD-10-CM | POA: Diagnosis not present

## 2023-05-31 DIAGNOSIS — M0579 Rheumatoid arthritis with rheumatoid factor of multiple sites without organ or systems involvement: Secondary | ICD-10-CM | POA: Diagnosis not present

## 2023-06-19 DIAGNOSIS — I10 Essential (primary) hypertension: Secondary | ICD-10-CM | POA: Diagnosis not present

## 2023-06-19 DIAGNOSIS — D509 Iron deficiency anemia, unspecified: Secondary | ICD-10-CM | POA: Diagnosis not present

## 2023-06-19 DIAGNOSIS — R7303 Prediabetes: Secondary | ICD-10-CM | POA: Diagnosis not present

## 2023-06-19 DIAGNOSIS — Z6841 Body Mass Index (BMI) 40.0 and over, adult: Secondary | ICD-10-CM | POA: Diagnosis not present

## 2023-06-29 DIAGNOSIS — G4733 Obstructive sleep apnea (adult) (pediatric): Secondary | ICD-10-CM | POA: Diagnosis not present

## 2023-07-18 DIAGNOSIS — Z6841 Body Mass Index (BMI) 40.0 and over, adult: Secondary | ICD-10-CM | POA: Diagnosis not present

## 2023-07-18 DIAGNOSIS — D509 Iron deficiency anemia, unspecified: Secondary | ICD-10-CM | POA: Diagnosis not present

## 2023-07-18 DIAGNOSIS — I10 Essential (primary) hypertension: Secondary | ICD-10-CM | POA: Diagnosis not present

## 2023-07-18 DIAGNOSIS — E66813 Obesity, class 3: Secondary | ICD-10-CM | POA: Diagnosis not present

## 2023-07-18 DIAGNOSIS — R7303 Prediabetes: Secondary | ICD-10-CM | POA: Diagnosis not present

## 2023-07-19 DIAGNOSIS — S2001XA Contusion of right breast, initial encounter: Secondary | ICD-10-CM | POA: Diagnosis not present

## 2023-07-19 DIAGNOSIS — Z6841 Body Mass Index (BMI) 40.0 and over, adult: Secondary | ICD-10-CM | POA: Diagnosis not present

## 2023-07-19 DIAGNOSIS — Z23 Encounter for immunization: Secondary | ICD-10-CM | POA: Diagnosis not present

## 2023-07-30 DIAGNOSIS — G4733 Obstructive sleep apnea (adult) (pediatric): Secondary | ICD-10-CM | POA: Diagnosis not present

## 2023-08-14 DIAGNOSIS — I10 Essential (primary) hypertension: Secondary | ICD-10-CM | POA: Diagnosis not present

## 2023-08-14 DIAGNOSIS — E66813 Obesity, class 3: Secondary | ICD-10-CM | POA: Diagnosis not present

## 2023-08-14 DIAGNOSIS — D509 Iron deficiency anemia, unspecified: Secondary | ICD-10-CM | POA: Diagnosis not present

## 2023-08-14 DIAGNOSIS — R7303 Prediabetes: Secondary | ICD-10-CM | POA: Diagnosis not present

## 2023-08-14 DIAGNOSIS — Z6841 Body Mass Index (BMI) 40.0 and over, adult: Secondary | ICD-10-CM | POA: Diagnosis not present

## 2023-08-24 ENCOUNTER — Other Ambulatory Visit (HOSPITAL_BASED_OUTPATIENT_CLINIC_OR_DEPARTMENT_OTHER): Payer: Self-pay

## 2023-08-24 ENCOUNTER — Other Ambulatory Visit: Payer: Self-pay

## 2023-08-24 ENCOUNTER — Emergency Department (HOSPITAL_BASED_OUTPATIENT_CLINIC_OR_DEPARTMENT_OTHER): Payer: Medicare PPO

## 2023-08-24 ENCOUNTER — Emergency Department (HOSPITAL_BASED_OUTPATIENT_CLINIC_OR_DEPARTMENT_OTHER)
Admission: EM | Admit: 2023-08-24 | Discharge: 2023-08-24 | Disposition: A | Discharge status: Home / Self Care | Payer: Medicare PPO | Attending: Emergency Medicine | Admitting: Emergency Medicine

## 2023-08-24 ENCOUNTER — Encounter (HOSPITAL_BASED_OUTPATIENT_CLINIC_OR_DEPARTMENT_OTHER): Payer: Self-pay | Admitting: Emergency Medicine

## 2023-08-24 DIAGNOSIS — Z853 Personal history of malignant neoplasm of breast: Secondary | ICD-10-CM | POA: Diagnosis not present

## 2023-08-24 DIAGNOSIS — I10 Essential (primary) hypertension: Secondary | ICD-10-CM | POA: Insufficient documentation

## 2023-08-24 DIAGNOSIS — Z90710 Acquired absence of both cervix and uterus: Secondary | ICD-10-CM | POA: Diagnosis not present

## 2023-08-24 DIAGNOSIS — R1031 Right lower quadrant pain: Secondary | ICD-10-CM | POA: Diagnosis not present

## 2023-08-24 DIAGNOSIS — Z79899 Other long term (current) drug therapy: Secondary | ICD-10-CM | POA: Insufficient documentation

## 2023-08-24 DIAGNOSIS — R109 Unspecified abdominal pain: Secondary | ICD-10-CM | POA: Insufficient documentation

## 2023-08-24 DIAGNOSIS — N200 Calculus of kidney: Secondary | ICD-10-CM | POA: Diagnosis not present

## 2023-08-24 LAB — COMPREHENSIVE METABOLIC PANEL
ALT: 11 U/L (ref 0–44)
AST: 15 U/L (ref 15–41)
Albumin: 4.3 g/dL (ref 3.5–5.0)
Alkaline Phosphatase: 55 U/L (ref 38–126)
Anion gap: 7 (ref 5–15)
BUN: 9 mg/dL (ref 8–23)
CO2: 33 mmol/L — ABNORMAL HIGH (ref 22–32)
Calcium: 9.7 mg/dL (ref 8.9–10.3)
Chloride: 101 mmol/L (ref 98–111)
Creatinine, Ser: 0.63 mg/dL (ref 0.44–1.00)
GFR, Estimated: >60 mL/min (ref >60)
Glucose, Bld: 82 mg/dL (ref 70–99)
Potassium: 3.5 mmol/L (ref 3.5–5.1)
Sodium: 141 mmol/L (ref 135–145)
Total Bilirubin: 0.4 mg/dL (ref <1.2)
Total Protein: 7.2 g/dL (ref 6.5–8.1)

## 2023-08-24 LAB — URINALYSIS, ROUTINE W REFLEX MICROSCOPIC
Bacteria, UA: NONE SEEN
Bilirubin Urine: NEGATIVE
Glucose, UA: NEGATIVE mg/dL
Hgb urine dipstick: NEGATIVE
Ketones, ur: NEGATIVE mg/dL
Leukocytes,Ua: NEGATIVE
Nitrite: NEGATIVE
Protein, ur: NEGATIVE mg/dL
Specific Gravity, Urine: 1.007 (ref 1.005–1.030)
pH: 6.5 (ref 5.0–8.0)

## 2023-08-24 LAB — CBC
HCT: 37.5 % (ref 36.0–46.0)
Hemoglobin: 12.2 g/dL (ref 12.0–15.0)
MCH: 29.6 pg (ref 26.0–34.0)
MCHC: 32.5 g/dL (ref 30.0–36.0)
MCV: 91 fL (ref 80.0–100.0)
Platelets: 210 10*3/uL (ref 150–400)
RBC: 4.12 MIL/uL (ref 3.87–5.11)
RDW: 14.5 % (ref 11.5–15.5)
WBC: 7 10*3/uL (ref 4.0–10.5)
nRBC: 0 % (ref 0.0–0.2)

## 2023-08-24 MED ORDER — KETOROLAC TROMETHAMINE 15 MG/ML IJ SOLN
15.0000 mg | Freq: Once | INTRAMUSCULAR | Status: AC
  Administered 2023-08-24: 15 mg via INTRAVENOUS
  Filled 2023-08-24: qty 1

## 2023-08-24 MED ORDER — IOHEXOL 300 MG/ML  SOLN
80.0000 mL | Freq: Once | INTRAMUSCULAR | Status: AC | PRN
  Administered 2023-08-24: 80 mL via INTRAVENOUS

## 2023-08-24 MED ORDER — KETOROLAC TROMETHAMINE 15 MG/ML IJ SOLN: 15.0000 mg | Freq: Once | INTRAMUSCULAR | Status: DC

## 2023-08-24 MED ORDER — POLYETHYLENE GLYCOL 3350 17 G PO PACK
17.0000 g | PACK | Freq: Every day | ORAL | 0 refills | Status: AC
  Filled 2023-08-24: qty 14, 14d supply, fill #0

## 2023-08-24 MED ORDER — HYDROCODONE-ACETAMINOPHEN 5-325 MG PO TABS
1.0000 | ORAL_TABLET | Freq: Once | ORAL | Status: DC
  Filled 2023-08-24: qty 1

## 2023-08-24 MED ORDER — LIDOCAINE 5 % EX PTCH
1.0000 | MEDICATED_PATCH | CUTANEOUS | 0 refills | Status: AC
  Filled 2023-08-24: qty 30, 30d supply, fill #0

## 2023-08-24 MED ORDER — LIDOCAINE 5 % EX PTCH
1.0000 | MEDICATED_PATCH | CUTANEOUS | Status: DC
  Administered 2023-08-24: 1 via TRANSDERMAL
  Filled 2023-08-24: qty 1

## 2023-08-24 NOTE — ED Notes (Signed)
Discharge paperwork given and verbally understood. 

## 2023-08-24 NOTE — ED Provider Notes (Signed)
[facility] AT [facility] Provider Note   CSN: [id number] Arrival date & time: 08/24/23  4098     History  Chief Complaint  Patient presents with   Flank Pain    [name] is a 62 y.o. female.  HPI   62 year old female presents emergency department with complaints of right-sided flank pain.  Patient states that symptoms began yesterday morning when she was getting up from bed.  Reports pain worsened with movement and relieved with rest.  Has been taking Tylenol without significant improvement of symptoms.  Denies history of similar symptoms in the past.  Denies radiation of pain.  Denies any fever, nausea, vomiting, urinary symptoms, change in bowel habits.  Last bowel movement this morning and regular per patient.  Past medical history significant for hypertension, hyperparathyroidism, rheumatoid arthritis on methotrexate, GERD, breast cancer with bilateral mastectomy, OSA, laparoscopic gastric banding device  Home Medications Prior to Admission medications   Medication Sig Start Date End Date Taking? Authorizing Provider  lidocaine (LIDODERM) 5 % Place 1 patch onto the skin daily. Remove & Discard patch within 12 hours or as directed by MD 08/24/23  Yes Sherian Maroon A, PA  polyethylene glycol (MIRALAX) 17 g packet Take 17 g by mouth daily. 08/24/23  Yes Sherian Maroon A, PA  acetaminophen (TYLENOL) 650 MG CR tablet Take 1,300 mg by mouth every 8 (eight) hours as needed for pain.    [provider]  Ascorbic Acid (VITAMIN C) 1000 MG tablet Take 1,000 mg by mouth daily.    [provider]  calcium carbonate (TUMS - DOSED IN MG ELEMENTAL CALCIUM) 500 MG chewable tablet Chew 500 mg by mouth daily as needed for indigestion or heartburn.     [provider]  cetirizine (ZYRTEC) 10 MG tablet Take 10 mg by mouth daily.     [provider]  Cholecalciferol (VITAMIN D-3) 125 MCG (5000 UT) TABS Take 5,000 Units by  mouth daily.    [provider]  ferrous sulfate 324 MG TBEC Take 324 mg by mouth daily.    [provider]  folic acid (FOLVITE) 1 MG tablet Take 1 mg by mouth daily.  10/27/12   [provider]  hydrochlorothiazide (HYDRODIURIL) 25 MG tablet Take 25 mg by mouth daily.      [provider]  hydroxychloroquine (PLAQUENIL) 200 MG tablet Take 200 mg by mouth at bedtime.  06/18/20   [provider]  Magnesium 250 MG TABS Take 250 mg by mouth daily.    [provider]  metFORMIN (GLUCOPHAGE-XR) 500 MG 24 hr tablet Take 500 mg by mouth every evening.    [provider]  methotrexate 50 MG/2ML injection Inject 25 mg into the skin once a week.  04/05/20   [provider]  Multiple Vitamins-Minerals (MULTIVITAMIN ADULTS 50+) TABS Take 1 tablet by mouth daily.    [provider]  olmesartan (BENICAR) 40 MG tablet Take 40 mg by mouth daily.    [provider]  pantoprazole (PROTONIX) 40 MG tablet Take 40 mg by mouth daily. 05/20/20   [provider]  Polyethyl Glycol-Propyl Glycol (SYSTANE OP) Place 1 drop into both eyes 2 (two) times daily as needed (for dry eyes).    [provider]  Turmeric 500 MG CAPS Take 500 mg by mouth daily.    [provider]  Vitamin E 100 units TABS Take 100 mg by mouth daily.    [provider]  zinc gluconate 50 MG tablet Take 50 mg by mouth daily.    [provider]      Allergies    Oxycodone and Lisinopril    Review of Systems   Review of Systems  All other systems reviewed and are negative.   Physical Exam Updated Vital Signs BP (!) 154/75   Pulse (!) 52   Temp (!) 97.4 F (36.3 C) (Axillary)   Resp 19   Wt 108.4 kg   SpO2 99%   BMI 43.71 kg/m  Physical Exam Vitals and nursing note reviewed.  Constitutional:      General: She is not in acute distress.    Appearance: She is well-developed.  HENT:     Head: Normocephalic and  atraumatic.  Eyes:     Conjunctiva/sclera: Conjunctivae normal.  Cardiovascular:     Rate and Rhythm: Normal rate and regular rhythm.     Heart sounds: No murmur heard. Pulmonary:     Effort: Pulmonary effort is normal. No respiratory distress.     Breath sounds: Normal breath sounds.  Abdominal:     Palpations: Abdomen is soft.     Tenderness: There is abdominal tenderness. There is no guarding.     Comments: Right lateral mid abdominal tenderness.  No obvious right-sided CVA tenderness or anterior abdominal tenderness.  Musculoskeletal:        General: No swelling.     Cervical back: Neck supple.  Skin:    General: Skin is warm and dry.     Capillary Refill: Capillary refill takes less than 2 seconds.     Findings: No rash.  Neurological:     Mental Status: She is alert.  Psychiatric:        Mood and Affect: Mood normal.     ED Results / Procedures / Treatments   Labs (all labs ordered are listed, but only abnormal results are displayed) Labs Reviewed  COMPREHENSIVE METABOLIC PANEL - Abnormal; Notable for the following components:      Result Value   CO2 33 (*)    All other components within normal limits  URINALYSIS, ROUTINE W REFLEX MICROSCOPIC - Abnormal; Notable for the following components:   Color, Urine COLORLESS (*)    All other components within normal limits  CBC    EKG None  Radiology CT ABDOMEN PELVIS W CONTRAST  Result Date: 08/24/2023 CLINICAL DATA:  Right lower quadrant abdominal pain since yesterday. EXAM: CT ABDOMEN AND PELVIS WITH CONTRAST TECHNIQUE: Multidetector CT imaging of the abdomen and pelvis was performed using the standard protocol following bolus administration of intravenous contrast. RADIATION DOSE REDUCTION: This exam was performed according to the departmental dose-optimization program which includes automated exposure control, adjustment of the mA and/or kV according to patient size and/or use of iterative reconstruction technique.  CONTRAST:  80mL OMNIPAQUE IOHEXOL 300 MG/ML  SOLN COMPARISON:  10/11/2015. FINDINGS: Lower chest: Clear lung bases. Hepatobiliary: No focal liver abnormality is seen. No gallstones, gallbladder wall thickening, or biliary dilatation. Pancreas: Unremarkable. No pancreatic ductal dilatation or surrounding inflammatory changes. Spleen: Normal in size without focal abnormality. Adrenals/Urinary Tract: No adrenal masses. Kidneys normal size, orientation and position with symmetric enhancement and excretion. 9 mm cyst, medial mid to lower pole the right kidney. No follow-up indicated. No other renal masses. Tiny nonobstructing stone in lower pole the left kidney. No other intrarenal strokes. No hydronephrosis. Normal ureters. Normal bladder. Stomach/Bowel: Stable surgical vascular clips adjacent to the proximal stomach. Stomach otherwise unremarkable. Small bowel and colon  are normal in caliber. No wall thickening. No inflammation. Mild generalized increase in the colonic and rectal stool burden. Normal appendix visualized. Vascular/Lymphatic: No significant vascular findings are present. No enlarged abdominal or pelvic lymph nodes. Reproductive: Status post hysterectomy. No adnexal masses. Other: No abdominal wall hernia or abnormality. No abdominopelvic ascites. Musculoskeletal: No fracture or acute finding.  No bone lesion. IMPRESSION: 1. No acute findings within the abdomen or pelvis. Normal appendix. 2. Mild generalized increase in the colonic and rectal stool burden. Electronically Signed   By: Veronica Portland M.D.   On: 08/24/2023 11:14    Procedures Procedures    Medications Ordered in ED Medications  lidocaine (LIDODERM) 5 % 1 patch (1 patch Transdermal Patch Applied 08/24/23 0924)  iohexol (OMNIPAQUE) 300 MG/ML solution 80 mL (80 mLs Intravenous Contrast Given 08/24/23 1043)  ketorolac (TORADOL) 15 MG/ML injection 15 mg (15 mg Intravenous Given 08/24/23 1127)    ED Course/ Medical Decision Making/  A&P                                 Medical Decision Making Amount and/or Complexity of Data Reviewed Labs: ordered. Radiology: ordered.  Risk OTC drugs. Prescription drug management.   This patient presents to the ED for concern of abdominal pain, this involves an extensive number of treatment options, and is a complaint that carries with it a high risk of complications and morbidity.  The differential diagnosis includes muscular strain, pyelonephritis, nephrolithiasis, cystitis, diverticulitis, appendicitis, SBO/LBO, volvulus, gastritis, PUD, cholecystitis, CBD pathology, other   Co morbidities that complicate the patient evaluation  See HPI   Additional history obtained:  Additional history obtained from EMR External records from outside source obtained and reviewed including hospital records   Lab Tests:  I Ordered, and personally interpreted labs.  The pertinent results include: No leukocytosis.  No evidence of anemia.  Platelets within range.  UA without abnormality.  Mild increase in bicarb of 33.  No electrolyte abnormalities otherwise.  No transaminitis.  No renal dysfunction.   Imaging Studies ordered:  I ordered imaging studies including CT abdomen pelvis I independently visualized and interpreted imaging which showed no acute abnormalities.  Moderate colonic stool burden I agree with the radiologist interpretation   Cardiac Monitoring: / EKG:  The patient was maintained on a cardiac monitor.  I personally viewed and interpreted the cardiac monitored which showed an underlying rhythm of: Rhythm   Consultations Obtained:  N/a   Problem List / ED Course / Critical interventions / Medication management  Right flank pain I ordered medication including Norco, Lidoderm   Reevaluation of the patient after these medicines showed that the patient improved I have reviewed the patients home medicines and have made adjustments as needed   Social Determinants  of Health:  Denies tobacco, licit drug use   Test / Admission - Considered:  Right flank pain Vitals signs within normal range and stable throughout visit. Laboratory/imaging studies significant for: See above 62 year old female presents emergency department with complaints of right-sided flank pain.  Pain began yesterday morning after getting out of bed and has been worsened with movement since then.  On exam, patient with tenderness right lateral abdomen.  No obvious CVA tenderness or anterior abdominal tenderness.  Workup today overall reassuring.  Labs appear normal.  CT imaging without any acute process but with moderate colonic stool burden.  Symptoms most likely secondary to musculoskeletal etiology given HPI but  given constipation, will treat concurrently.  No overlying rash concerning for herpes zoster.  Will recommend treatment of pain at home with Tylenol/Motrin as well as treatment of constipation with titrated to effect MiraLAX.  Will recommend follow-up with primary care for reassessment.  Treatment plan discussed at length with patient and she acknowledged understanding was agreeable to said plan.  Patient overall well-appearing, afebrile in no acute distress. Worrisome signs and symptoms were discussed with the patient, and the patient acknowledged understanding to return to the ED if noticed. Patient was stable upon discharge.          Final Clinical Impression(s) / ED Diagnoses Final diagnoses:  Right flank pain    Rx / DC Orders ED Discharge Orders          Ordered    polyethylene glycol (MIRALAX) 17 g packet  Daily        08/24/23 1122    lidocaine (LIDODERM) 5 %  Every 24 hours        08/24/23 1126              [name], [place] 08/24/23 1254    Veronica Sprout, MD 08/24/23 1530

## 2023-08-24 NOTE — ED Triage Notes (Signed)
Pt c/o RT flank pain starting yesterday. Denies dysuria,

## 2023-08-24 NOTE — Discharge Instructions (Addendum)
As discussed, workup today overall reassuring.  CT scan did show evidence of moderate stool burden which could be causing your symptoms.  I suspect he also have muscular injury given that symptoms occurred when you are getting out of bed.  Your labs were normal.  Recommend continued use of numbing patches over area as well as use of Tylenol/Motrin at home as needed for pain.  Will also recommend use of MiraLAX at home for treatment of constipation.  Recommend beginning with 2-4 capfuls/packets and increasing as needed for invoking bowel movements and decreasing if you develop diarrhea.  Recommend adequate oral hydration as well.  Recommend follow-up with your primary care for reassessment of your symptoms.  Please do not hesitate to return to emergency department for worrisome signs and symptoms we discussed become apparent.

## 2023-08-24 NOTE — ED Notes (Signed)
ED Provider at bedside. 

## 2023-08-24 NOTE — ED Notes (Signed)
Pt aware of the need for a urine... Unable to currently collect the sample.Marland KitchenMarland Kitchen

## 2023-08-29 DIAGNOSIS — G4733 Obstructive sleep apnea (adult) (pediatric): Secondary | ICD-10-CM | POA: Diagnosis not present

## 2023-08-30 DIAGNOSIS — Z79899 Other long term (current) drug therapy: Secondary | ICD-10-CM | POA: Diagnosis not present

## 2023-08-30 DIAGNOSIS — M0579 Rheumatoid arthritis with rheumatoid factor of multiple sites without organ or systems involvement: Secondary | ICD-10-CM | POA: Diagnosis not present

## 2023-08-30 DIAGNOSIS — Z6841 Body Mass Index (BMI) 40.0 and over, adult: Secondary | ICD-10-CM | POA: Diagnosis not present

## 2023-08-30 DIAGNOSIS — M5459 Other low back pain: Secondary | ICD-10-CM | POA: Diagnosis not present

## 2023-08-30 DIAGNOSIS — M1991 Primary osteoarthritis, unspecified site: Secondary | ICD-10-CM | POA: Diagnosis not present

## 2023-09-27 DIAGNOSIS — I10 Essential (primary) hypertension: Secondary | ICD-10-CM | POA: Diagnosis not present

## 2023-09-27 DIAGNOSIS — D509 Iron deficiency anemia, unspecified: Secondary | ICD-10-CM | POA: Diagnosis not present

## 2023-09-27 DIAGNOSIS — E66813 Obesity, class 3: Secondary | ICD-10-CM | POA: Diagnosis not present

## 2023-09-27 DIAGNOSIS — R7303 Prediabetes: Secondary | ICD-10-CM | POA: Diagnosis not present

## 2023-09-27 DIAGNOSIS — Z6841 Body Mass Index (BMI) 40.0 and over, adult: Secondary | ICD-10-CM | POA: Diagnosis not present

## 2023-10-03 DIAGNOSIS — G4733 Obstructive sleep apnea (adult) (pediatric): Secondary | ICD-10-CM | POA: Diagnosis not present

## 2023-10-11 DIAGNOSIS — G4733 Obstructive sleep apnea (adult) (pediatric): Secondary | ICD-10-CM | POA: Diagnosis not present

## 2023-10-11 DIAGNOSIS — B070 Plantar wart: Secondary | ICD-10-CM | POA: Diagnosis not present

## 2023-10-11 DIAGNOSIS — Z1501 Genetic susceptibility to malignant neoplasm of breast: Secondary | ICD-10-CM | POA: Diagnosis not present

## 2023-10-11 DIAGNOSIS — Z6841 Body Mass Index (BMI) 40.0 and over, adult: Secondary | ICD-10-CM | POA: Diagnosis not present

## 2023-10-11 DIAGNOSIS — I10 Essential (primary) hypertension: Secondary | ICD-10-CM | POA: Diagnosis not present

## 2023-10-11 DIAGNOSIS — M0579 Rheumatoid arthritis with rheumatoid factor of multiple sites without organ or systems involvement: Secondary | ICD-10-CM | POA: Diagnosis not present

## 2023-10-11 DIAGNOSIS — R7303 Prediabetes: Secondary | ICD-10-CM | POA: Diagnosis not present

## 2023-10-11 DIAGNOSIS — D638 Anemia in other chronic diseases classified elsewhere: Secondary | ICD-10-CM | POA: Diagnosis not present

## 2023-11-01 DIAGNOSIS — E66813 Obesity, class 3: Secondary | ICD-10-CM | POA: Diagnosis not present

## 2023-11-01 DIAGNOSIS — R7303 Prediabetes: Secondary | ICD-10-CM | POA: Diagnosis not present

## 2023-11-01 DIAGNOSIS — I10 Essential (primary) hypertension: Secondary | ICD-10-CM | POA: Diagnosis not present

## 2023-11-01 DIAGNOSIS — D509 Iron deficiency anemia, unspecified: Secondary | ICD-10-CM | POA: Diagnosis not present

## 2023-11-01 DIAGNOSIS — Z6841 Body Mass Index (BMI) 40.0 and over, adult: Secondary | ICD-10-CM | POA: Diagnosis not present

## 2023-11-05 DIAGNOSIS — B070 Plantar wart: Secondary | ICD-10-CM | POA: Diagnosis not present

## 2023-11-05 DIAGNOSIS — Z6841 Body Mass Index (BMI) 40.0 and over, adult: Secondary | ICD-10-CM | POA: Diagnosis not present

## 2023-11-26 DIAGNOSIS — M069 Rheumatoid arthritis, unspecified: Secondary | ICD-10-CM | POA: Diagnosis not present

## 2023-11-26 DIAGNOSIS — E119 Type 2 diabetes mellitus without complications: Secondary | ICD-10-CM | POA: Diagnosis not present

## 2023-11-26 DIAGNOSIS — H40012 Open angle with borderline findings, low risk, left eye: Secondary | ICD-10-CM | POA: Diagnosis not present

## 2023-11-26 DIAGNOSIS — H0102A Squamous blepharitis right eye, upper and lower eyelids: Secondary | ICD-10-CM | POA: Diagnosis not present

## 2023-11-26 DIAGNOSIS — H2513 Age-related nuclear cataract, bilateral: Secondary | ICD-10-CM | POA: Diagnosis not present

## 2023-11-26 DIAGNOSIS — H0102B Squamous blepharitis left eye, upper and lower eyelids: Secondary | ICD-10-CM | POA: Diagnosis not present

## 2023-11-26 DIAGNOSIS — H11153 Pinguecula, bilateral: Secondary | ICD-10-CM | POA: Diagnosis not present

## 2023-11-26 DIAGNOSIS — H04123 Dry eye syndrome of bilateral lacrimal glands: Secondary | ICD-10-CM | POA: Diagnosis not present

## 2023-11-26 DIAGNOSIS — Z79899 Other long term (current) drug therapy: Secondary | ICD-10-CM | POA: Diagnosis not present

## 2023-11-28 DIAGNOSIS — M0579 Rheumatoid arthritis with rheumatoid factor of multiple sites without organ or systems involvement: Secondary | ICD-10-CM | POA: Diagnosis not present

## 2023-11-29 DIAGNOSIS — R7303 Prediabetes: Secondary | ICD-10-CM | POA: Diagnosis not present

## 2023-11-29 DIAGNOSIS — D509 Iron deficiency anemia, unspecified: Secondary | ICD-10-CM | POA: Diagnosis not present

## 2023-11-29 DIAGNOSIS — I10 Essential (primary) hypertension: Secondary | ICD-10-CM | POA: Diagnosis not present

## 2023-11-29 DIAGNOSIS — Z6841 Body Mass Index (BMI) 40.0 and over, adult: Secondary | ICD-10-CM | POA: Diagnosis not present

## 2023-11-29 DIAGNOSIS — E66813 Obesity, class 3: Secondary | ICD-10-CM | POA: Diagnosis not present

## 2023-12-13 DIAGNOSIS — Z6841 Body Mass Index (BMI) 40.0 and over, adult: Secondary | ICD-10-CM | POA: Diagnosis not present

## 2023-12-13 DIAGNOSIS — B070 Plantar wart: Secondary | ICD-10-CM | POA: Diagnosis not present

## 2023-12-25 DIAGNOSIS — R7303 Prediabetes: Secondary | ICD-10-CM | POA: Diagnosis not present

## 2023-12-25 DIAGNOSIS — D509 Iron deficiency anemia, unspecified: Secondary | ICD-10-CM | POA: Diagnosis not present

## 2023-12-25 DIAGNOSIS — I10 Essential (primary) hypertension: Secondary | ICD-10-CM | POA: Diagnosis not present

## 2023-12-25 DIAGNOSIS — Z6841 Body Mass Index (BMI) 40.0 and over, adult: Secondary | ICD-10-CM | POA: Diagnosis not present

## 2023-12-25 DIAGNOSIS — E66813 Obesity, class 3: Secondary | ICD-10-CM | POA: Diagnosis not present

## 2024-01-04 DIAGNOSIS — G4733 Obstructive sleep apnea (adult) (pediatric): Secondary | ICD-10-CM | POA: Diagnosis not present

## 2024-01-24 DIAGNOSIS — I10 Essential (primary) hypertension: Secondary | ICD-10-CM | POA: Diagnosis not present

## 2024-01-24 DIAGNOSIS — D509 Iron deficiency anemia, unspecified: Secondary | ICD-10-CM | POA: Diagnosis not present

## 2024-01-24 DIAGNOSIS — R7303 Prediabetes: Secondary | ICD-10-CM | POA: Diagnosis not present

## 2024-01-24 DIAGNOSIS — E66813 Obesity, class 3: Secondary | ICD-10-CM | POA: Diagnosis not present

## 2024-01-24 DIAGNOSIS — Z6841 Body Mass Index (BMI) 40.0 and over, adult: Secondary | ICD-10-CM | POA: Diagnosis not present

## 2024-02-26 DIAGNOSIS — Z6841 Body Mass Index (BMI) 40.0 and over, adult: Secondary | ICD-10-CM | POA: Diagnosis not present

## 2024-02-26 DIAGNOSIS — D509 Iron deficiency anemia, unspecified: Secondary | ICD-10-CM | POA: Diagnosis not present

## 2024-02-26 DIAGNOSIS — E66813 Obesity, class 3: Secondary | ICD-10-CM | POA: Diagnosis not present

## 2024-02-26 DIAGNOSIS — I10 Essential (primary) hypertension: Secondary | ICD-10-CM | POA: Diagnosis not present

## 2024-02-26 DIAGNOSIS — R7303 Prediabetes: Secondary | ICD-10-CM | POA: Diagnosis not present

## 2024-02-28 DIAGNOSIS — M1991 Primary osteoarthritis, unspecified site: Secondary | ICD-10-CM | POA: Diagnosis not present

## 2024-02-28 DIAGNOSIS — Z79899 Other long term (current) drug therapy: Secondary | ICD-10-CM | POA: Diagnosis not present

## 2024-02-28 DIAGNOSIS — Z6841 Body Mass Index (BMI) 40.0 and over, adult: Secondary | ICD-10-CM | POA: Diagnosis not present

## 2024-02-28 DIAGNOSIS — M0579 Rheumatoid arthritis with rheumatoid factor of multiple sites without organ or systems involvement: Secondary | ICD-10-CM | POA: Diagnosis not present

## 2024-03-27 DIAGNOSIS — Z79899 Other long term (current) drug therapy: Secondary | ICD-10-CM | POA: Diagnosis not present

## 2024-03-27 DIAGNOSIS — Z0000 Encounter for general adult medical examination without abnormal findings: Secondary | ICD-10-CM | POA: Diagnosis not present

## 2024-03-27 DIAGNOSIS — I10 Essential (primary) hypertension: Secondary | ICD-10-CM | POA: Diagnosis not present

## 2024-03-27 DIAGNOSIS — R7303 Prediabetes: Secondary | ICD-10-CM | POA: Diagnosis not present

## 2024-03-27 DIAGNOSIS — Z1331 Encounter for screening for depression: Secondary | ICD-10-CM | POA: Diagnosis not present

## 2024-04-02 DIAGNOSIS — E66813 Obesity, class 3: Secondary | ICD-10-CM | POA: Diagnosis not present

## 2024-04-02 DIAGNOSIS — D509 Iron deficiency anemia, unspecified: Secondary | ICD-10-CM | POA: Diagnosis not present

## 2024-04-02 DIAGNOSIS — R7303 Prediabetes: Secondary | ICD-10-CM | POA: Diagnosis not present

## 2024-04-02 DIAGNOSIS — Z6841 Body Mass Index (BMI) 40.0 and over, adult: Secondary | ICD-10-CM | POA: Diagnosis not present

## 2024-04-02 DIAGNOSIS — I10 Essential (primary) hypertension: Secondary | ICD-10-CM | POA: Diagnosis not present

## 2024-04-08 DIAGNOSIS — Z1389 Encounter for screening for other disorder: Secondary | ICD-10-CM | POA: Diagnosis not present

## 2024-04-08 DIAGNOSIS — K219 Gastro-esophageal reflux disease without esophagitis: Secondary | ICD-10-CM | POA: Diagnosis not present

## 2024-04-08 DIAGNOSIS — M75101 Unspecified rotator cuff tear or rupture of right shoulder, not specified as traumatic: Secondary | ICD-10-CM | POA: Diagnosis not present

## 2024-04-08 DIAGNOSIS — M75102 Unspecified rotator cuff tear or rupture of left shoulder, not specified as traumatic: Secondary | ICD-10-CM | POA: Diagnosis not present

## 2024-04-08 DIAGNOSIS — J309 Allergic rhinitis, unspecified: Secondary | ICD-10-CM | POA: Diagnosis not present

## 2024-04-08 DIAGNOSIS — I10 Essential (primary) hypertension: Secondary | ICD-10-CM | POA: Diagnosis not present

## 2024-04-08 DIAGNOSIS — R7303 Prediabetes: Secondary | ICD-10-CM | POA: Diagnosis not present

## 2024-04-08 DIAGNOSIS — Z6841 Body Mass Index (BMI) 40.0 and over, adult: Secondary | ICD-10-CM | POA: Diagnosis not present

## 2024-04-10 DIAGNOSIS — G4733 Obstructive sleep apnea (adult) (pediatric): Secondary | ICD-10-CM | POA: Diagnosis not present

## 2024-07-16 DIAGNOSIS — Z6841 Body Mass Index (BMI) 40.0 and over, adult: Secondary | ICD-10-CM | POA: Diagnosis not present

## 2024-07-16 DIAGNOSIS — D509 Iron deficiency anemia, unspecified: Secondary | ICD-10-CM | POA: Diagnosis not present

## 2024-07-16 DIAGNOSIS — R7303 Prediabetes: Secondary | ICD-10-CM | POA: Diagnosis not present

## 2024-07-16 DIAGNOSIS — E66813 Obesity, class 3: Secondary | ICD-10-CM | POA: Diagnosis not present

## 2024-07-16 DIAGNOSIS — I10 Essential (primary) hypertension: Secondary | ICD-10-CM | POA: Diagnosis not present

## 2024-07-30 DIAGNOSIS — Z23 Encounter for immunization: Secondary | ICD-10-CM | POA: Diagnosis not present

## 2024-07-30 DIAGNOSIS — R519 Headache, unspecified: Secondary | ICD-10-CM | POA: Diagnosis not present

## 2024-07-30 DIAGNOSIS — H6992 Unspecified Eustachian tube disorder, left ear: Secondary | ICD-10-CM | POA: Diagnosis not present

## 2024-07-30 DIAGNOSIS — Z6841 Body Mass Index (BMI) 40.0 and over, adult: Secondary | ICD-10-CM | POA: Diagnosis not present

## 2024-08-19 DIAGNOSIS — E66812 Obesity, class 2: Secondary | ICD-10-CM | POA: Diagnosis not present

## 2024-08-19 DIAGNOSIS — D509 Iron deficiency anemia, unspecified: Secondary | ICD-10-CM | POA: Diagnosis not present

## 2024-08-19 DIAGNOSIS — Z6839 Body mass index (BMI) 39.0-39.9, adult: Secondary | ICD-10-CM | POA: Diagnosis not present

## 2024-08-19 DIAGNOSIS — I10 Essential (primary) hypertension: Secondary | ICD-10-CM | POA: Diagnosis not present

## 2024-08-19 DIAGNOSIS — R7303 Prediabetes: Secondary | ICD-10-CM | POA: Diagnosis not present
